# Patient Record
Sex: Female | Born: 1970 | Race: Black or African American | Hispanic: No | Marital: Married | Smoking: Never smoker
Health system: Southern US, Community
[De-identification: ages and names within clinical notes are randomized; demographics above are authoritative.]

## PROBLEM LIST (undated history)

## (undated) DIAGNOSIS — D649 Anemia, unspecified: Secondary | ICD-10-CM

## (undated) HISTORY — PX: APPENDECTOMY: SHX54

## (undated) HISTORY — DX: Anemia, unspecified: D64.9

## (undated) HISTORY — PX: ADRENALECTOMY: SHX876

## (undated) HISTORY — PX: ABDOMINAL HYSTERECTOMY: SHX81

---

## 2016-03-08 DIAGNOSIS — N281 Cyst of kidney, acquired: Secondary | ICD-10-CM | POA: Insufficient documentation

## 2016-03-08 DIAGNOSIS — N838 Other noninflammatory disorders of ovary, fallopian tube and broad ligament: Secondary | ICD-10-CM

## 2016-03-08 DIAGNOSIS — D352 Benign neoplasm of pituitary gland: Secondary | ICD-10-CM | POA: Insufficient documentation

## 2016-03-08 DIAGNOSIS — K862 Cyst of pancreas: Secondary | ICD-10-CM

## 2016-03-08 HISTORY — DX: Other noninflammatory disorders of ovary, fallopian tube and broad ligament: N83.8

## 2016-03-08 HISTORY — DX: Benign neoplasm of pituitary gland: D35.2

## 2016-03-08 HISTORY — DX: Cyst of kidney, acquired: N28.1

## 2016-11-10 DIAGNOSIS — L719 Rosacea, unspecified: Secondary | ICD-10-CM

## 2016-11-10 DIAGNOSIS — D352 Benign neoplasm of pituitary gland: Secondary | ICD-10-CM

## 2016-11-10 HISTORY — DX: Benign neoplasm of pituitary gland: D35.2

## 2016-11-10 HISTORY — DX: Rosacea, unspecified: L71.9

## 2019-11-02 ENCOUNTER — Other Ambulatory Visit: Payer: Self-pay

## 2019-11-02 ENCOUNTER — Ambulatory Visit (INDEPENDENT_AMBULATORY_CARE_PROVIDER_SITE_OTHER): Payer: 59 | Admitting: Adult Health Nurse Practitioner

## 2019-11-02 ENCOUNTER — Ambulatory Visit: Payer: Self-pay | Admitting: Adult Health Nurse Practitioner

## 2019-11-02 ENCOUNTER — Telehealth: Payer: Self-pay

## 2019-11-02 VITALS — BP 125/78 | HR 73 | Temp 98.0°F | Ht 62.0 in | Wt 171.6 lb

## 2019-11-02 DIAGNOSIS — M545 Low back pain, unspecified: Secondary | ICD-10-CM

## 2019-11-02 DIAGNOSIS — E559 Vitamin D deficiency, unspecified: Secondary | ICD-10-CM | POA: Diagnosis not present

## 2019-11-02 DIAGNOSIS — E236 Other disorders of pituitary gland: Secondary | ICD-10-CM | POA: Diagnosis not present

## 2019-11-02 DIAGNOSIS — E229 Hyperfunction of pituitary gland, unspecified: Secondary | ICD-10-CM

## 2019-11-02 DIAGNOSIS — D352 Benign neoplasm of pituitary gland: Secondary | ICD-10-CM | POA: Diagnosis not present

## 2019-11-02 LAB — POCT URINALYSIS DIP (MANUAL ENTRY)
Bilirubin, UA: NEGATIVE
Glucose, UA: NEGATIVE mg/dL
Ketones, POC UA: NEGATIVE mg/dL
Leukocytes, UA: NEGATIVE
Nitrite, UA: NEGATIVE
Protein Ur, POC: NEGATIVE mg/dL
Spec Grav, UA: >=1.03 — AB (ref 1.010–1.025)
Urobilinogen, UA: 0.2 E.U./dL
pH, UA: 5.5 (ref 5.0–8.0)

## 2019-11-02 NOTE — Telephone Encounter (Signed)
LVM for pt to come back to the office so that we can do an ear lavage on her. She left before it was done.

## 2019-11-02 NOTE — Progress Notes (Signed)
  Chief Complaint  Patient presents with  . Establish Care    Pt stated that 4 years ago she had her uterus removed and now she has been experiencing lower back pain    HPI  Back pain:  Sitting down, no back pain at night.  No change in bowel or bladder habits.  Walking. No medicaiton.   Multiple surgeries:  Hasn't been same since.   Cancer in family:  Mom--liver ca,  Father high blood pressure/dm DM sister  Jody Heath has diet.   Mammogram 3 years ago  Anemia until 3 years ago  Small cyst on thyroid  No fatigue, sleeping okay, no anxiety or depression,   2 children, 10 and 15,  Home  Last labs > 3 years ago.    Needs mammo  Problem List    Problem List: There are no relevant problems documented for this patient.   Allergies   has no allergies on file.  Medications   No current outpatient medications on file.   Review of Systems    Constitutional: Negative for activity change, appetite change, chills and fever.  HENT: Negative for congestion, nosebleeds, trouble swallowing and voice change.   Respiratory: Negative for cough, shortness of breath and wheezing.   Cardiac:  Negative for chest pain, pressure, syncope  Gastrointestinal: Negative for diarrhea, nausea and vomiting.  Genitourinary: Negative for difficulty urinating, dysuria, flank pain and hematuria.  Musculoskeletal: Negative for back pain, joint swelling and neck pain.  Neurological: Negative for dizziness, speech difficulty, light-headedness and numbness.  See HPI. All other review of systems negative.     Physical Exam:    height is 5\' 2"  (1.575 m) and weight is 171 lb 9.6 oz (77.8 kg). Her temporal temperature is 98 F (36.7 C). Her blood pressure is 125/78 and her pulse is 73. Her oxygen saturation is 99%.   Physical Examination: General appearance - alert, well appearing, and in no distress and oriented to person, place, and time Mental status - normal mood, behavior, speech, dress, motor  activity, and thought processes Eyes - PERRL. Extraocular movements intact.  No nystagmus.  Neck - supple, no significant adenopathy, carotids upstroke normal bilaterally, no bruits, thyroid exam: thyroid is normal in size without nodules or tenderness Chest - clear to auscultation, no wheezes, rales or rhonchi, symmetric air entry  Heart - normal rate, regular rhythm, normal S1, S2, no murmurs, rubs, clicks or gallops Extremities - dependent LE edema without clubbing or cyanosis Skin - normal coloration and turgor, no rashes, no suspicious skin lesions noted  No hyperpigmentation of skin.  No current hematomas noted   Lab /Imaging Review    orders written for new lab studies as appropriate; see orders.   Assessment & Plan:  Jody Heath is a 49 y.o. female    1. Low back pain, unspecified back pain laterality, unspecified chronicity, unspecified whether sciatica present   2. 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 Prolactin deficiency ([place])   4. Vitamin D deficiency    Orders Placed This Encounter  Procedures  . POCT urinalysis dipstick   No orders of the defined types were placed in this encounter.    Glyn Ade, NP

## 2019-11-02 NOTE — Patient Instructions (Signed)
° ° ° °  If you have lab work done today you will be contacted with your lab results within the next 2 weeks.  If you have not heard from us then please contact us. The fastest way to get your results is to register for My Chart. ° ° °IF you received an x-ray today, you will receive an invoice from [facility]. Please contact [facility] at [phone] with questions or concerns regarding your invoice.  ° °IF you received labwork today, you will receive an invoice from [facility]. Please contact [facility] at [phone] with questions or concerns regarding your invoice.  ° °Our billing staff will not be able to assist you with questions regarding bills from these companies. ° °You will be contacted with the lab results as soon as they are available. The fastest way to get your results is to activate your My Chart account. Instructions are located on the last page of this paperwork. If you have not heard from us regarding the results in 2 weeks, please contact this office. °  ° ° ° °

## 2019-11-03 LAB — PROLACTIN: Prolactin: 109 ng/mL — ABNORMAL HIGH (ref 4.8–23.3)

## 2019-11-03 LAB — CBC WITH DIFFERENTIAL/PLATELET
Basophils Absolute: 0 10*3/uL (ref 0.0–0.2)
Basos: 0 %
EOS (ABSOLUTE): 0.2 10*3/uL (ref 0.0–0.4)
Eos: 5 %
Hematocrit: 44.7 % (ref 34.0–46.6)
Hemoglobin: 15.4 g/dL (ref 11.1–15.9)
Immature Grans (Abs): 0 10*3/uL (ref 0.0–0.1)
Immature Granulocytes: 0 %
Lymphocytes Absolute: 1.3 10*3/uL (ref 0.7–3.1)
Lymphs: 42 %
MCH: 30.6 pg (ref 26.6–33.0)
MCHC: 34.5 g/dL (ref 31.5–35.7)
MCV: 89 fL (ref 79–97)
Monocytes Absolute: 0.3 10*3/uL (ref 0.1–0.9)
Monocytes: 9 %
Neutrophils Absolute: 1.4 10*3/uL (ref 1.4–7.0)
Neutrophils: 44 %
Platelets: 156 10*3/uL (ref 150–450)
RBC: 5.03 x10E6/uL (ref 3.77–5.28)
RDW: 12.4 % (ref 11.7–15.4)
WBC: 3.1 10*3/uL — ABNORMAL LOW (ref 3.4–10.8)

## 2019-11-03 LAB — CMP14+EGFR
ALT: 22 IU/L (ref 0–32)
AST: 24 IU/L (ref 0–40)
Albumin/Globulin Ratio: 1.6 (ref 1.2–2.2)
Albumin: 4.8 g/dL (ref 3.8–4.8)
Alkaline Phosphatase: 66 IU/L (ref 39–117)
BUN/Creatinine Ratio: 9 (ref 9–23)
BUN: 7 mg/dL (ref 6–24)
Bilirubin Total: 0.5 mg/dL (ref 0.0–1.2)
CO2: 20 mmol/L (ref 20–29)
Calcium: 10.1 mg/dL (ref 8.7–10.2)
Chloride: 102 mmol/L (ref 96–106)
Creatinine, Ser: 0.75 mg/dL (ref 0.57–1.00)
GFR calc Af Amer: 110 mL/min/{1.73_m2} (ref >59)
GFR calc non Af Amer: 95 mL/min/{1.73_m2} (ref >59)
Globulin, Total: 3 g/dL (ref 1.5–4.5)
Glucose: 95 mg/dL (ref 65–99)
Potassium: 4.3 mmol/L (ref 3.5–5.2)
Sodium: 140 mmol/L (ref 134–144)
Total Protein: 7.8 g/dL (ref 6.0–8.5)

## 2019-11-03 LAB — LIPID PANEL
Chol/HDL Ratio: 3.8 ratio (ref 0.0–4.4)
Cholesterol, Total: 196 mg/dL (ref 100–199)
HDL: 51 mg/dL (ref >39)
LDL Chol Calc (NIH): 125 mg/dL — ABNORMAL HIGH (ref 0–99)
Triglycerides: 112 mg/dL (ref 0–149)
VLDL Cholesterol Cal: 20 mg/dL (ref 5–40)

## 2019-11-03 LAB — VITAMIN D 25 HYDROXY (VIT D DEFICIENCY, FRACTURES): Vit D, 25-Hydroxy: 21.5 ng/mL — ABNORMAL LOW (ref 30.0–100.0)

## 2019-11-03 LAB — PTH, INTACT AND CALCIUM: PTH: 35 pg/mL (ref 15–65)

## 2019-11-03 LAB — TSH: TSH: 3.93 u[IU]/mL (ref 0.450–4.500)

## 2019-11-09 ENCOUNTER — Other Ambulatory Visit: Payer: Self-pay

## 2019-11-09 ENCOUNTER — Encounter: Payer: Self-pay | Admitting: Adult Health Nurse Practitioner

## 2019-11-09 ENCOUNTER — Ambulatory Visit (INDEPENDENT_AMBULATORY_CARE_PROVIDER_SITE_OTHER): Payer: 59 | Admitting: Adult Health Nurse Practitioner

## 2019-11-09 VITALS — BP 113/70 | HR 75 | Temp 97.8°F | Ht 62.0 in | Wt 168.0 lb

## 2019-11-09 DIAGNOSIS — E559 Vitamin D deficiency, unspecified: Secondary | ICD-10-CM | POA: Insufficient documentation

## 2019-11-09 DIAGNOSIS — D352 Benign neoplasm of pituitary gland: Secondary | ICD-10-CM | POA: Insufficient documentation

## 2019-11-09 DIAGNOSIS — E236 Other disorders of pituitary gland: Secondary | ICD-10-CM

## 2019-11-09 DIAGNOSIS — M47819 Spondylosis without myelopathy or radiculopathy, site unspecified: Secondary | ICD-10-CM

## 2019-11-09 DIAGNOSIS — M4055 Lordosis, unspecified, thoracolumbar region: Secondary | ICD-10-CM

## 2019-11-09 DIAGNOSIS — E229 Hyperfunction of pituitary gland, unspecified: Secondary | ICD-10-CM

## 2019-11-09 DIAGNOSIS — E221 Hyperprolactinemia: Secondary | ICD-10-CM

## 2019-11-09 DIAGNOSIS — M545 Low back pain, unspecified: Secondary | ICD-10-CM

## 2019-11-09 HISTORY — DX: Benign neoplasm of pituitary gland: D35.2

## 2019-11-09 HISTORY — DX: Lordosis, unspecified, thoracolumbar region: M40.55

## 2019-11-09 HISTORY — DX: Spondylosis without myelopathy or radiculopathy, site unspecified: M47.819

## 2019-11-09 HISTORY — DX: Vitamin D deficiency, unspecified: E55.9

## 2019-11-09 HISTORY — DX: Other disorders of pituitary gland: E23.6

## 2019-11-09 HISTORY — DX: Low back pain, unspecified: M54.50

## 2019-11-09 HISTORY — DX: Hyperprolactinemia: E22.1

## 2019-11-09 MED ORDER — VITAMIN D (ERGOCALCIFEROL) 1.25 MG (50000 UNIT) PO CAPS: 50000.0000 [IU] | ORAL_CAPSULE | ORAL | 3 refills | Status: AC

## 2019-11-09 NOTE — Progress Notes (Signed)
Chief Complaint  Patient presents with  . Back Pain    pt states the pain is mostly the same as last visit pain starts at the top of the back and gos all the way down. pt states she is having more intence pain in her Lower left side of her back.  . Alopecia    started 2 years ago. pt hasn't tryied any [facility] medications or shampoos to help.     HPI  Patient presents for follow-up after her labs.  Her prolactin was extremely elevated.  With a history of a pituitary microadenoma, will refer to endocrinology for further management.  Thoracic back pain and low back pain approximately the same and located in the paraspinal muscles.  I tried to explain to the patient that this is has a lot to do with the muscles in the area as she thinks that the pain started after her uterus was removed.  Certainly if the muscles were interrupted by a surgery she would have some laxity in that area which could cause further back pain.  We discussed some exercises she can do for her back and she was given a printout on further exercises.  In addition, she declines any anti-inflammatories but is amenable to a physical therapy.  She is having some hair loss.  I am not certain that this is not from what is going on with her pituitary microadenoma, vitamin D, or potentially subclinical hypothyroidism.  However I would like to supplement her vitamin D as well as have her see the endocrinologist before addressing this with any medication, or further evaluation.  Lipids were normal.  CBC was normal with the exception of mildly low white blood cell count at 3.1.  We will repeat that in 6 weeks when the patient returns. 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  Medications    Current Outpatient Medications:  Marland Kitchen  Vitamin D, Ergocalciferol, (DRISDOL) 1.25 MG (50000 UNIT) CAPS capsule, Take 1 capsule (50,000 Units total) by mouth every 7 (seven) days., Disp: 5 capsule, Rfl: 3   Review of Systems    Constitutional: Negative for activity change, appetite change, chills and fever.  HENT: Negative for congestion, nosebleeds, trouble swallowing and voice change.   Respiratory: Negative for cough, shortness of breath and wheezing.   Cardiac:  Negative for chest pain, pressure, syncope  Gastrointestinal: Negative for diarrhea, nausea and vomiting.  Genitourinary: Negative for difficulty urinating, dysuria, flank pain and hematuria.  Musculoskeletal: Negative for back pain, joint swelling and neck pain.  Neurological: Negative for dizziness, speech difficulty, light-headedness and numbness.  See HPI. All other review of systems negative.     Physical Exam:    height is 5\' 2"  (1.575 m) and weight is 168 lb (76.2 kg). Her temporal temperature is 97.8 F (36.6 C). Her blood pressure is 113/70 and her pulse is 75. Her oxygen saturation is 98%.   Physical Examination: General appearance - alert, well appearing, and in no distress and oriented to person, place, and time Mental status - normal mood, behavior, speech, dress, motor activity, and thought processes Eyes - PERRL. Extraocular movements intact.  No nystagmus.  Neck - supple, no significant adenopathy, carotids upstroke normal bilaterally, no bruits, thyroid exam: thyroid is normal in size without nodules or  tenderness Chest - clear to auscultation, no wheezes, rales or rhonchi, symmetric air entry  Heart - normal rate, regular rhythm, normal S1, S2, no murmurs, rubs, clicks or gallops Extremities - dependent LE edema without clubbing or cyanosis Skin - normal coloration and turgor, no rashes, no suspicious skin lesions noted  No hyperpigmentation of skin.  No current hematomas  noted   Lab /Imaging Review    See HPI for review  Assessment & Plan:  Janaysia Stertz is a 49 y.o. female    1. Thoracic lordosis   2. Facet arthropathy, multilevel   3. 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 Hyperprolactinemia ([place])    Orders Placed This Encounter  Procedures  . Ambulatory referral to Physical Therapy  . Ambulatory referral to Endocrinology   Meds ordered this encounter  Medications  . Vitamin D, Ergocalciferol, (DRISDOL) 1.25 MG (50000 UNIT) CAPS capsule    Sig: Take 1 capsule (50,000 Units total) by mouth every 7 (seven) days.    Dispense:  5 capsule    Refill:  3   A total of 35 minutes were spent face-to-face with the patient during this encounter and over half of that time was spent on counseling and coordination of care.   Glyn Ade, NP

## 2019-11-09 NOTE — Patient Instructions (Addendum)
Low Back Sprain or Strain Rehab Ask your health care provider which exercises are safe for you. Do exercises exactly as told by your health care provider and adjust them as directed. It is normal to feel mild stretching, pulling, tightness, or discomfort as you do these exercises. Stop right away if you feel sudden pain or your pain gets worse. Do not begin these exercises until told by your health care provider. Stretching and range-of-motion exercises These exercises warm up your muscles and joints and improve the movement and flexibility of your back. These exercises also help to relieve pain, numbness, and tingling. Lumbar rotation  1. Lie on your back on a firm surface and bend your knees. 2. Straighten your arms out to your sides so each arm forms a 90-degree angle (right angle) with a side of your body. 3. Slowly move (rotate) both of your knees to one side of your body until you feel a stretch in your lower back (lumbar). Try not to let your shoulders lift off the floor. 4. Hold this position for __________ seconds. 5. Tense your abdominal muscles and slowly move your knees back to the starting position. 6. Repeat this exercise on the other side of your body. Repeat __________ times. Complete this exercise __________ times a day. Single knee to chest  1. Lie on your back on a firm surface with both legs straight. 2. Bend one of your knees. Use your hands to move your knee up toward your chest until you feel a gentle stretch in your lower back and buttock. ? Hold your leg in this position by holding on to the front of your knee. ? Keep your other leg as straight as possible. 3. Hold this position for __________ seconds. 4. Slowly return to the starting position. 5. Repeat with your other leg. Repeat __________ times. Complete this exercise __________ times a day. Prone extension on elbows  1. Lie on your abdomen on a firm surface (prone position). 2. Prop yourself up on your  elbows. 3. Use your arms to help lift your chest up until you feel a gentle stretch in your abdomen and your lower back. ? This will place some of your body weight on your elbows. If this is uncomfortable, try stacking pillows under your chest. ? Your hips should stay down, against the surface that you are lying on. Keep your hip and back muscles relaxed. 4. Hold this position for __________ seconds. 5. Slowly relax your upper body and return to the starting position. Repeat __________ times. Complete this exercise __________ times a day. Strengthening exercises These exercises build strength and endurance in your back. Endurance is the ability to use your muscles for a long time, even after they get tired. Pelvic tilt This exercise strengthens the muscles that lie deep in the abdomen. 1. Lie on your back on a firm surface. Bend your knees and keep your feet flat on the floor. 2. Tense your abdominal muscles. Tip your pelvis up toward the ceiling and flatten your lower back into the floor. ? To help with this exercise, you may place a small towel under your lower back and try to push your back into the towel. 3. Hold this position for __________ seconds. 4. Let your muscles relax completely before you repeat this exercise. Repeat __________ times. Complete this exercise __________ times a day. Alternating arm and leg raises  1. Get on your hands and knees on a firm surface. If you are on a hard floor,  you may want to use padding, such as an exercise mat, to cushion your knees. 2. Line up your arms and legs. Your hands should be directly below your shoulders, and your knees should be directly below your hips. 3. Lift your left leg behind you. At the same time, raise your right arm and straighten it in front of you. ? Do not lift your leg higher than your hip. ? Do not lift your arm higher than your shoulder. ? Keep your abdominal and back muscles tight. ? Keep your hips facing the  ground. ? Do not arch your back. ? Keep your balance carefully, and do not hold your breath. 4. Hold this position for __________ seconds. 5. Slowly return to the starting position. 6. Repeat with your right leg and your left arm. Repeat __________ times. Complete this exercise __________ times a day. Abdominal set with straight leg raise  1. Lie on your back on a firm surface. 2. Bend one of your knees and keep your other leg straight. 3. Tense your abdominal muscles and lift your straight leg up, 4-6 inches (10-15 cm) off the ground. 4. Keep your abdominal muscles tight and hold this position for __________ seconds. ? Do not hold your breath. ? Do not arch your back. Keep it flat against the ground. 5. Keep your abdominal muscles tense as you slowly lower your leg back to the starting position. 6. Repeat with your other leg. Repeat __________ times. Complete this exercise __________ times a day. Single leg lower with bent knees 1. Lie on your back on a firm surface. 2. Tense your abdominal muscles and lift your feet off the floor, one foot at a time, so your knees and hips are bent in 90-degree angles (right angles). ? Your knees should be over your hips and your lower legs should be parallel to the floor. 3. Keeping your abdominal muscles tense and your knee bent, slowly lower one of your legs so your toe touches the ground. 4. Lift your leg back up to return to the starting position. ? Do not hold your breath. ? Do not let your back arch. Keep your back flat against the ground. 5. Repeat with your other leg. Repeat __________ times. Complete this exercise __________ times a day. Posture and body mechanics Good posture and healthy body mechanics can help to relieve stress in your body's tissues and joints. Body mechanics refers to the movements and positions of your body while you do your daily activities. Posture is part of body mechanics. Good posture means:  Your spine is in its  natural S-curve position (neutral).  Your shoulders are pulled back slightly.  Your head is not tipped forward. Follow these guidelines to improve your posture and body mechanics in your everyday activities. Standing   When standing, keep your spine neutral and your feet about hip width apart. Keep a slight bend in your knees. Your ears, shoulders, and hips should line up.  When you do a task in which you stand in one place for a long time, place one foot up on a stable object that is 2-4 inches (5-10 cm) high, such as a footstool. This helps keep your spine neutral. Sitting   When sitting, keep your spine neutral and keep your feet flat on the floor. Use a footrest, if necessary, and keep your thighs parallel to the floor. Avoid rounding your shoulders, and avoid tilting your head forward.  When working at a desk or a Teaching laboratory technician, keep your  desk at a height where your hands are slightly lower than your elbows. Slide your chair under your desk so you are close enough to maintain good posture.  When working at a computer, place your monitor at a height where you are looking straight ahead and you do not have to tilt your head forward or downward to look at the screen. Resting  When lying down and resting, avoid positions that are most painful for you.  If you have pain with activities such as sitting, bending, stooping, or squatting, lie in a position in which your body does not bend very much. For example, avoid curling up on your side with your arms and knees near your chest (fetal position).  If you have pain with activities such as standing for a long time or reaching with your arms, lie with your spine in a neutral position and bend your knees slightly. Try the following positions: ? Lying on your side with a pillow between your knees. ? Lying on your back with a pillow under your knees. Lifting   When lifting objects, keep your feet at least shoulder width apart and tighten your  abdominal muscles.  Bend your knees and hips and keep your spine neutral. It is important to lift using the strength of your legs, not your back. Do not lock your knees straight out.  Always ask for help to lift heavy or awkward objects. This information is not intended to replace advice given to you by your health care provider. Make sure you discuss any questions you have with your health care provider. Document Revised: 11/24/2018 Document Reviewed: 08/24/2018 Elsevier Patient Education  [place].  Thoracic Strain Rehab Ask your health care provider which exercises are safe for you. Do exercises exactly as told by your health care provider and adjust them as directed. It is normal to feel mild stretching, pulling, tightness, or discomfort as you do these exercises. Stop right away if you feel sudden pain or your pain gets worse. Do not begin these exercises until told by your health care provider. Stretching and range-of-motion exercise This exercise warms up your muscles and joints and improves the movement and flexibility of your back and shoulders. This exercise also helps to relieve pain. Chest and spine stretch  7. Lie down on your back on a firm surface. 8. Roll a towel or a small blanket so it is about 4 inches (10 cm) in diameter. 9. Put the towel lengthwise under the middle of your back so it is under your spine, but not under your shoulder blades. 10. Put your hands behind your head and let your elbows fall to your sides. This will increase your stretch. 11. Take a deep breath (inhale). 12. Hold for __________ seconds. 13. Relax after you breathe out (exhale). Repeat __________ times. Complete this exercise __________ times a day. Strengthening exercises These exercises build strength and endurance in your back and your shoulder blade muscles. Endurance is the ability to use your muscles for a long time, even after they get tired. Alternating arm and leg  raises  6. Get on your hands and knees on a firm surface. If you are on a hard floor, you may want to use padding, such as an exercise mat, to cushion your knees. 7. Line up your arms and legs. Your hands should be directly below your shoulders, and your knees should be directly below your hips. 8. Lift your left leg behind you. At the same time, raise  your right arm and straighten it in front of you. ? Do not lift your leg higher than your hip. ? Do not lift your arm higher than your shoulder. ? Keep your abdominal and back muscles tight. ? Keep your hips facing the ground. ? Do not arch your back. ? Keep your balance carefully, and do not hold your breath. 9. Hold for __________ seconds. 10. Slowly return to the starting position and repeat with your right leg and your left arm. Repeat __________ times. Complete this exercise __________ times a day. Straight arm rows This exercise is also called shoulder extension exercise. 6. Stand with your feet shoulder width apart. 7. Secure an exercise band to a stable object in front of you so the band is at or above shoulder height. 8. Hold one end of the exercise band in each hand. 9. Straighten your elbows and lift your hands up to shoulder height. 10. Step back, away from the secured end of the exercise band, until the band stretches. 11. Squeeze your shoulder blades together and pull your hands down to the sides of your thighs. Stop when your hands are straight down by your sides. This is shoulder extension. Do not let your hands go behind your body. 12. Hold for __________ seconds. 13. Slowly return to the starting position. Repeat __________ times. Complete this exercise __________ times a day. Prone shoulder external rotation 5. Lie on your abdomen on a firm bed so your left / right forearm hangs over the edge of the bed and your upper arm is on the bed, straight out from your body. This is the prone position. ? Your elbow should be  bent. ? Your palm should be facing your feet. 6. If instructed, hold a __________ weight in your hand. 7. Squeeze your shoulder blade toward the middle of your back. Do not let your shoulder lift toward your ear. 8. Keep your elbow bent in a 90-degree angle (right angle) while you slowly move your forearm up toward the ceiling. Move your forearm up to the height of the bed, toward your head. This is external rotation. ? Your upper arm should not move. ? At the top of the movement, your palm should face the floor. 9. Hold for __________ seconds. 10. Slowly return to the starting position and relax your muscles. Repeat __________ times. Complete this exercise __________ times a day. Rowing scapular retraction This is an exercise in which the shoulder blades (scapulae) are pulled toward each other (retraction). 7. Sit in a stable chair without armrests, or stand up. 8. Secure an exercise band to a stable object in front of you so the band is at shoulder height. 9. Hold one end of the exercise band in each hand. Your palms should face down. 10. Bring your arms out straight in front of you. 11. Step back, away from the secured end of the exercise band, until the band stretches. 12. Pull the band backward. As you do this, bend your elbows and squeeze your shoulder blades together, but avoid letting the rest of your body move. Do not shrug your shoulders upward while you do this. 13. Stop when your elbows are at your sides or slightly behind your body. 14. Hold for __________ seconds. 15. Slowly straighten your arms to return to the starting position. Repeat __________ times. Complete this exercise __________ times a day. Posture and body mechanics Good posture and healthy body mechanics can help to relieve stress in your body's tissues and joints. Body  mechanics refers to the movements and positions of your body while you do your daily activities. Posture is part of body mechanics. Good posture  means:  Your spine is in its natural S-curve position (neutral).  Your shoulders are pulled back slightly.  Your head is not tipped forward. Follow these guidelines to improve your posture and body mechanics in your everyday activities. Standing   When standing, keep your spine neutral and your feet about hip width apart. Keep a slight bend in your knees. Your ears, shoulders, and hips should line up with each other.  When you do a task in which you lean forward while standing in one place for a long time, place one foot up on a stable object that is 2-4 inches (5-10 cm) high, such as a footstool. This helps keep your spine neutral. Sitting   When sitting, keep your spine neutral and keep your feet flat on the floor. Use a footrest, if necessary, and keep your thighs parallel to the floor. Avoid rounding your shoulders, and avoid tilting your head forward.  When working at a desk or a computer, keep your desk at a height where your hands are slightly lower than your elbows. Slide your chair under your desk so you are close enough to maintain good posture.  When working at a computer, place your monitor at a height where you are looking straight ahead and you do not have to tilt your head forward or downward to look at the screen. Resting When lying down and resting, avoid positions that are most painful for you.  If you have pain with activities such as sitting, bending, stooping, or squatting (flexion-basedactivities), lie in a position in which your body does not bend very much. For example, avoid curling up on your side with your arms and knees near your chest (fetal position).  If you have pain with activities such as standing for a long time or reaching with your arms (extension-basedactivities), lie with your spine in a neutral position and bend your knees slightly. Try the following positions: ? Lie on your side with a pillow between your knees. ? Lie on your back with a pillow  under your knees.  Lifting   When lifting objects, keep your feet at least shoulder width apart and tighten your abdominal muscles.  Bend your knees and hips and keep your spine neutral. It is important to lift using the strength of your legs, not your back. Do not lock your knees straight out.  Always ask for help to lift heavy or awkward objects. This information is not intended to replace advice given to you by your health care provider. Make sure you discuss any questions you have with your health care provider. Document Revised: 11/24/2018 Document Reviewed: 09/11/2018 Elsevier Patient Education  [place].

## 2019-11-21 ENCOUNTER — Encounter: Payer: Self-pay | Admitting: Endocrinology

## 2019-11-21 NOTE — Progress Notes (Signed)
Patient ID: Jody Heath, female   DOB: 1970-11-05, 49 y.o.   MRN: [id number]             Referring PCP: Wendall Mola, NP  Chief complaint: High prolactin  History of Present Illness   She apparently had problems with milk discharge from her breasts and 2018 and was found to have high prolactin level This was done by a physician in [place] and no records are available She does not know what her prolactin level was or what her MRI scan showed  She has had a hysterectomy a few years ago  She does not complain of any unusual headaches or change in vision  She thinks that with taking cabergoline half tablet twice a week her prolactin level had come down and after about 1-1/2 years the medication was stopped but she did not have a follow-up subsequently  She says she is not sure whether she has milk discharge from her breast as she does not check for this but did not have any discharge spontaneously  Prolactin levels:  Lab Results  Component Value Date   PROLACTIN 109.0 (H) 11/02/2019     MRI of pituitary gland: Report from 2018 not available   She is now referred here for further management  Allergies as of 11/22/2019   No Known Allergies     Medication List       Accurate as of November 21, 2019  9:06 PM. If you have any questions, ask your nurse or doctor.        Vitamin D (Ergocalciferol) 1.25 MG (50000 UNIT) Caps capsule Commonly known as: DRISDOL Take 1 capsule (50,000 Units total) by mouth every 7 (seven) days.       Allergies: No Known Allergies  Past Medical History:  Diagnosis Date  . Hyperprolactinemia ([place]) 11/09/2019    History reviewed. No pertinent surgical history.  History reviewed. No pertinent family history.  Social History:  reports that she has never smoked. She has never used smokeless tobacco. No history on file for alcohol and drug.   Review of Systems  Constitutional: Negative for weight loss.  HENT: Negative for  headaches.   Eyes: Negative for visual disturbance.  Respiratory: Negative for shortness of breath.   Cardiovascular: Negative for leg swelling.  Endocrine: Negative for fatigue.  Musculoskeletal: Negative for joint pain.  Neurological: Negative for numbness.   She was found to have vitamin D deficiency on routine testing and is being treated by PCP with prescription vitamin D2  EXAM:  There were no vitals taken for this visit.  Physical Exam Constitutional:      Appearance: Normal appearance.  HENT:     Nose:     Comments: Oral exam not indicated Eyes:     Conjunctiva/sclera: Conjunctivae normal.     Comments: Optic disks normal on fundus exam  Neck:     Thyroid: No thyromegaly.  Cardiovascular:     Rate and Rhythm: Regular rhythm.     Heart sounds: Normal heart sounds. No murmur. No gallop.   Pulmonary:     Breath sounds: Normal breath sounds. No wheezing or rales.  Abdominal:     General: There is no distension.     Palpations: Abdomen is soft. There is no mass.     Tenderness: There is no abdominal tenderness.  Musculoskeletal:        General: No swelling.  Lymphadenopathy:     Cervical: No cervical adenopathy.  Skin:    General: Skin  is warm and dry.     Coloration: Skin is not pale.  Neurological:     Deep Tendon Reflexes: Reflexes normal.      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 She has minimal symptoms although previously had galactorrhea Unable to assess her menstrual cycles because of hysterectomy Although she reportedly had an MRI done in 2018 no reports are available No symptoms of headaches or visual disturbances suggestive of a large pituitary adenoma Also no systemic symptoms of unusual fatigue to indicate hypopituitarism    PLAN:     Restart Dostinex 0.5 mg, half tablet twice a week Follow-up in about 6 weeks to reassess response and adjust dose as needed MRI of pituitary gland Will  request records of previous evaluation from her treating physician in [place]  Copy of the consultation has been sent to the referring PCP  Elayne Snare 11/21/19   Note: This office note was prepared with Dragon voice recognition system technology. Any transcriptional errors that result from this process are unintentional.

## 2019-11-22 ENCOUNTER — Other Ambulatory Visit: Payer: Self-pay

## 2019-11-22 ENCOUNTER — Encounter: Payer: Self-pay | Admitting: Endocrinology

## 2019-11-22 ENCOUNTER — Ambulatory Visit (INDEPENDENT_AMBULATORY_CARE_PROVIDER_SITE_OTHER): Payer: 59 | Admitting: Endocrinology

## 2019-11-22 VITALS — BP 110/60 | HR 83 | Ht 62.0 in | Wt 167.4 lb

## 2019-11-22 DIAGNOSIS — E221 Hyperprolactinemia: Secondary | ICD-10-CM

## 2019-11-22 DIAGNOSIS — N643 Galactorrhea not associated with childbirth: Secondary | ICD-10-CM | POA: Diagnosis not present

## 2019-11-22 MED ORDER — CABERGOLINE 0.5 MG PO TABS: 0.2500 mg | ORAL_TABLET | ORAL | 0 refills | Status: DC

## 2019-11-30 ENCOUNTER — Ambulatory Visit: Payer: 59 | Attending: Adult Health Nurse Practitioner | Admitting: Physical Therapy

## 2019-11-30 ENCOUNTER — Other Ambulatory Visit: Payer: Self-pay

## 2019-11-30 ENCOUNTER — Encounter: Payer: Self-pay | Admitting: Physical Therapy

## 2019-11-30 DIAGNOSIS — M545 Low back pain, unspecified: Secondary | ICD-10-CM

## 2019-11-30 DIAGNOSIS — M546 Pain in thoracic spine: Secondary | ICD-10-CM | POA: Insufficient documentation

## 2019-11-30 NOTE — Patient Instructions (Signed)
    Access Code: [email or web address]: [email or web address]: 04/16/2021Prepared by: Anderson Malta PaaExercises  Cat-Camel - 2 x daily - 7 x weekly - 2 sets - 10 reps - 10 hold  Supine Lower Trunk Rotation - 1 x daily - 7 x weekly - 1 sets - 10 reps - 30 hold  Sidelying Thoracic Rotation with Open Book - 1 x daily - 7 x weekly - 1 sets - 10 reps - 15 hold  Supine Bridge - 1 x daily - 7 x weekly - 2 sets - 10 reps - 5 hold

## 2019-11-30 NOTE — Therapy (Signed)
[place], [place], [address] Phone: [phone]   Fax:  [phone]  Physical Therapy Evaluation  Patient Details  Name: Jody Heath MRN: [id number] Date of Birth: June 13, 1971 Referring Provider (PT): Dr. Jens Som   Encounter Date: 11/30/2019  PT End of Session - 11/30/19 1457    Visit Number  1    Number of Visits  6    Date for PT Re-Evaluation  01/11/20    PT Start Time  0922    PT Stop Time  1003    PT Time Calculation (min)  41 min    Activity Tolerance  Patient tolerated treatment well    Behavior During Therapy  [facility] for tasks assessed/performed       Past Medical History:  Diagnosis Date  . Hyperprolactinemia ([place]) 11/09/2019    History reviewed. No pertinent surgical history.  There were no vitals filed for this visit.   Subjective Assessment - 11/30/19 0930    Subjective  Pt presents with back pain which began after her surgeries (about 3 yrs ago) not continuously. She reports increased freq of her back pain , especially with working long hours.  Pain is bilateral and in mid to low back.  She denies weakness.  She reports a disc problem long ago.    Patient is accompained by:  Interpreter    Pertinent History  3-4 abdominal surgeries, hysterectomy.    Limitations  Standing;Sitting;House hold activities    How long can you walk comfortably?  walks for exercise 2 hours daily    Diagnostic tests  Blood test, no recent XR    Patient Stated Goals  pain relief    Currently in Pain?  Yes    Pain Score  8     Pain Location  Back    Pain Orientation  Right;Left;Lower    Pain Descriptors / Indicators  Sharp;Aching    Pain Type  Chronic pain    Pain Onset  More than a month ago    Pain Frequency  Intermittent    Aggravating Factors   standing 4-5 hours    Pain Relieving Factors  Sitting, walking, changing positions    Effect of Pain on Daily Activities  hard to stand, do housework     Multiple Pain Sites  No         [facility] PT Assessment - 11/30/19 0001      Assessment   Medical Diagnosis  thoracic pain     Referring Provider (PT)  Dr. Jens Som    Prior Therapy  long ago       Precautions   Precautions  None      Balance Screen   Has the patient fallen in the past 6 months  No      [place] residence    Living Arrangements  Spouse/significant other;Children      Prior Function   Level of [place]  Works at home    Leisure  exercise, has 2 children      Cognition   Overall Cognitive Status  Within Functional Limits for tasks assessed      Observation/Other Assessments   Focus on Therapeutic Outcomes (FOTO)   NT due to language barrier       Sensation   Light Touch  Appears Intact      Posture/Postural Control   Posture/Postural Control  No significant limitations  Posture Comments  mildly reduced thoracic kyphosis      AROM   Overall AROM Comments  WNL in L spine , LEs and UEs       Strength   Overall Strength Comments  LEs WNL , including hip abd and ext.    lower abs fair      Palpation   Spinal mobility  hypomobile in T3 to T-L junction     Palpation comment  painful with palpation to paraspinals thoracic to low lumbar , hypertonicity noted throughout      Transfers   Comments  No issues       Ambulation/Gait   Gait Comments  Normal gait pattern                 Objective measurements completed on examination: See above findings.      [place] Adult PT Treatment/Exercise - 11/30/19 0001      Lumbar Exercises: Stretches   Lower Trunk Rotation  10 seconds    Lower Trunk Rotation Limitations  x 10     Other Lumbar Stretch Exercise  sidelyig upper trunk rotation x 5 each side       Lumbar Exercises: Supine   Bridge Limitations  articulating x 10       Lumbar Exercises: Sidelying   Other Sidelying Lumbar Exercises  upper trunk rotation x 5 each        Lumbar Exercises: Quadruped   Madcat/Old Horse  5 reps    Madcat/Old Horse Limitations  demo              PT Education - 11/30/19 1457    Education Details  spine stiffness, eval findings, POC, HEP    Person(s) Educated  Patient    Methods  Explanation;Demonstration;Verbal cues;Handout    Comprehension  Verbalized understanding;Returned demonstration;Need further instruction          PT Long Term Goals - 11/30/19 1458      PT LONG TERM GOAL #1   Title  Pt will be I with HEP for spine mobility and posture    Time  6    Period  Weeks    Status  New    Target Date  01/11/20      PT LONG TERM GOAL #2   Title  Pt will be able to lift, carry items in her home without increasing back pain, using proper technique    Time  6    Period  Weeks    Status  New    Target Date  01/11/20      PT LONG TERM GOAL #3   Title  Pt will be able to stand as long as she wants to with pain < 5/10.    Baseline  standing >4 hours pain 8/10    Time  6    Period  Weeks    Status  New    Target Date  01/11/20      PT LONG TERM GOAL #4   Title  Further goals TBA             Plan - 11/30/19 1500    Clinical Impression Statement  Pt presents for low complexity eval of chronic mid to low back pain without radicular symptoms.  She has normal AROM, strength and gait pattern.  She is able to stand 4-5 hours at a time but reports pain severe after this time. I encouraged her to stretch in addition to her walking routine.  She will  likely only need 4-6 visits of PT for corrective exercises.    Personal Factors and Comorbidities  Time since onset of injury/illness/exacerbation    Examination-Activity Limitations  Carry;Sit;Stand;Lift    Examination-Participation Restrictions  Yard Work;Cleaning;Laundry;Community Activity;Meal Prep;Shop    Stability/Clinical Decision Making  Stable/Uncomplicated    Clinical Decision Making  Low    Rehab Potential  Excellent    PT Frequency  1x / week     PT Duration  6 weeks    PT Treatment/Interventions  Therapeutic exercise;Dry needling;Manual techniques;Patient/family education;Moist Heat;Electrical Stimulation;Spinal Manipulations    PT Next Visit Plan  try T-L junction release/foam roller into ext. check HEP , begin core    PT Home Exercise Plan  spine mobility (cat/camel, bridge, rotation)    Consulted and Agree with Plan of Care  Patient       Patient will benefit from skilled therapeutic intervention in order to improve the following deficits and impairments:  Decreased strength, Hypomobility, Increased fascial restricitons, Decreased mobility, Impaired flexibility, Postural dysfunction  Visit Diagnosis: Pain in thoracic spine  Bilateral low back pain without sciatica, unspecified chronicity     Problem List Patient Active Problem List   Diagnosis Date Noted  . 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 Prolactin deficiency ([place]) 11/09/2019  . Vitamin D deficiency 11/09/2019  . Low back pain 11/09/2019  . 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 Thoracic lordosis 11/09/2019  . Hyperprolactinemia ([place]) 11/09/2019    Mark Hassey 11/30/2019, 3:07 PM  [facility] [address] [place], [place], [address] Phone: [phone]   Fax:  [phone]  Name: Jody Heath MRN: [id number] Date of Birth: [birth year]   Raeford Razor, PT 11/30/19 3:07 PM Phone: [phone] Fax: [phone]

## 2019-12-11 ENCOUNTER — Ambulatory Visit: Payer: 59 | Admitting: Physical Therapy

## 2019-12-11 ENCOUNTER — Other Ambulatory Visit: Payer: Self-pay

## 2019-12-11 DIAGNOSIS — M546 Pain in thoracic spine: Secondary | ICD-10-CM | POA: Diagnosis not present

## 2019-12-11 DIAGNOSIS — M545 Low back pain, unspecified: Secondary | ICD-10-CM

## 2019-12-11 NOTE — Therapy (Cosign Needed)
[place], [place], [address] Phone: [phone]   Fax:  [phone]  Physical Therapy Treatment  Patient Details  Name: Jody Heath MRN: [id number] Date of Birth: 1970/09/27 Referring Provider (PT): Dr. Jens Som   Encounter Date: 12/11/2019  PT End of Session - 12/11/19 0942    Visit Number  2    Date for PT Re-Evaluation  01/11/20    PT Start Time  0857    PT Stop Time  0940    PT Time Calculation (min)  43 min    Activity Tolerance  Patient tolerated treatment well    Behavior During Therapy  [facility] for tasks assessed/performed       Past Medical History:  Diagnosis Date  . Hyperprolactinemia ([place]) 11/09/2019    No past surgical history on file.  There were no vitals filed for this visit.  Subjective Assessment - 12/11/19 0859    Subjective  Pt states that she has discomfort not pain her mid back, she has been doing her home exercises, which has been makingher feel a "little better", stated increase in pain to 5/10 while doing standing trunk exercises    Currently in Pain?  No/denies    Pain Score  0-No pain                       [facility] Adult PT Treatment/Exercise - 12/11/19 0001      Exercises   Exercises  Shoulder      Lumbar Exercises: Aerobic   Elliptical  I 6, R5, 5 min    Nustep  L4x6 min      Lumbar Exercises: Standing   Other Standing Lumbar Exercises  trunk rotation w/ yellow tband, 2x10 each direction      Lumbar Exercises: Supine   Ab Set  10 reps;3 seconds    Other Supine Lumbar Exercises  throacic rotation S/L w/ 3 #   pain onset from standing     Shoulder Exercises: ROM/Strengthening   Lat Pull  1.5 plate;20 reps    Cybex Row  1.5 plate;20 reps                  PT Long Term Goals - 12/11/19 0948      PT LONG TERM GOAL #1   Title  Pt will be I with HEP for spine mobility and posture    Time  6    Period  Weeks    Status  Partially Met      PT LONG TERM GOAL #2   Title  Pt will be able to lift, carry items in her home without increasing back pain, using proper technique    Time  6    Period  Weeks    Status  On-going      PT LONG TERM GOAL #3   Title  Pt will be able to stand as long as she wants to with pain < 5/10.    Baseline  standing >4 hours pain 8/10    Time  6    Period  Weeks    Status  Partially Met      PT LONG TERM GOAL #4   Title  Further goals TBA    Status  On-going            Plan - 12/11/19 0943    Clinical Impression Statement  Pt showed up 15 min late to PT, translater  was present, shewas able to verbalized home exercises independently, her pain still is a 5/10 while standing, she seems to understand posture from the rows and lat pulldowns, pt tolerated treatment well.    Examination-Activity Limitations  Carry;Sit;Stand;Lift;Continence    Examination-Participation Restrictions  Yard Work;Cleaning;Laundry;Community Activity;Meal Prep;Shop    Stability/Clinical Decision Making  Stable/Uncomplicated    Rehab Potential  Good    PT Frequency  1x / week    PT Duration  6 weeks    PT Treatment/Interventions  Therapeutic exercise;Dry needling;Manual techniques;Patient/family education;Moist Heat;Electrical Stimulation;Spinal Manipulations    PT Next Visit Plan  try T-L junction release/foam roller into ext. check HEP , begin core       Patient will benefit from skilled therapeutic intervention in order to improve the following deficits and impairments:  Decreased strength, Hypomobility, Increased fascial restricitons, Decreased mobility, Impaired flexibility, Postural dysfunction  Visit Diagnosis: Pain in thoracic spine  Bilateral low back pain without sciatica, unspecified chronicity     Problem List Patient Active Problem List   Diagnosis Date Noted  . 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 Prolactin deficiency ([place]) 11/09/2019  .  Vitamin D deficiency 11/09/2019  . Low back pain 11/09/2019  . 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 Thoracic lordosis 11/09/2019  . Hyperprolactinemia ([place]) 11/09/2019    Clarene Essex, SPTA 12/11/2019, 9:51 AM  [place], [place], [address] Phone: [phone]   Fax:  [phone]  Name: Jody Heath MRN: [id number] Date of Birth: 10-03-1970

## 2019-12-11 NOTE — Therapy (Signed)
[place], [place], [address] Phone: [phone]   Fax:  [phone]  Physical Therapy Treatment  Patient Details  Name: Jody Heath MRN: [id number] Date of Birth: Feb 26, 1971 Referring Provider (PT): Dr. Jens Som   Encounter Date: 12/11/2019  PT End of Session - 12/11/19 0942    Visit Number  2    Date for PT Re-Evaluation  01/11/20    PT Start Time  0857    PT Stop Time  0940    PT Time Calculation (min)  43 min    Activity Tolerance  Patient tolerated treatment well    Behavior During Therapy  [facility] for tasks assessed/performed       Past Medical History:  Diagnosis Date  . Hyperprolactinemia ([place]) 11/09/2019    No past surgical history on file.  There were no vitals filed for this visit.  Subjective Assessment - 12/11/19 0859    Subjective  Pt states that she has discomfort not pain her mid back, she has been doing her home exercises, which has been makingher feel a "little better", stated increase in pain to 5/10 while doing standing trunk exercises    Currently in Pain?  No/denies    Pain Score  0-No pain                       [facility] Adult PT Treatment/Exercise - 12/11/19 0001      Exercises   Exercises  Shoulder      Lumbar Exercises: Aerobic   Elliptical  I 6, R5, 5 min    Nustep  L4x6 min      Lumbar Exercises: Standing   Other Standing Lumbar Exercises  trunk rotation w/ yellow tband, 2x10 each direction      Lumbar Exercises: Supine   Ab Set  10 reps;3 seconds    Other Supine Lumbar Exercises  throacic rotation S/L w/ 3 #   pain onset from standing     Shoulder Exercises: ROM/Strengthening   Lat Pull  1.5 plate;20 reps    Cybex Row  1.5 plate;20 reps             PT Education - 12/11/19 1022    Education Details  scap stab and trunk rotation standing    Person(s) Educated  Patient;Other (comment)    Methods   Explanation;Demonstration;Verbal cues;Handout    Comprehension  Verbalized understanding;Returned demonstration          PT Long Term Goals - 12/11/19 0948      PT LONG TERM GOAL #1   Title  Pt will be I with HEP for spine mobility and posture    Time  6    Period  Weeks    Status  Partially Met      PT LONG TERM GOAL #2   Title  Pt will be able to lift, carry items in her home without increasing back pain, using proper technique    Time  6    Period  Weeks    Status  On-going      PT LONG TERM GOAL #3   Title  Pt will be able to stand as long as she wants to with pain < 5/10.    Baseline  standing >4 hours pain 8/10    Time  6    Period  Weeks    Status  Partially Met      PT LONG TERM GOAL #  4   Title  Further goals TBA    Status  On-going            Plan - 12/11/19 0943    Clinical Impression Statement  Pt showed up 15 min late to PT, translater was present, shewas able to verbalized home exercises independently, her pain still is a 5/10 while standing, she seems to understand posture from the rows and lat pulldowns, pt tolerated treatment well.    Examination-Activity Limitations  Carry;Sit;Stand;Lift;Continence    Examination-Participation Restrictions  Yard Work;Cleaning;Laundry;Community Activity;Meal Prep;Shop    Stability/Clinical Decision Making  Stable/Uncomplicated    Rehab Potential  Good    PT Frequency  1x / week    PT Duration  6 weeks    PT Treatment/Interventions  Therapeutic exercise;Dry needling;Manual techniques;Patient/family education;Moist Heat;Electrical Stimulation;Spinal Manipulations    PT Next Visit Plan  try T-L junction release/foam roller into ext. check HEP , begin core       Patient will benefit from skilled therapeutic intervention in order to improve the following deficits and impairments:  Decreased strength, Hypomobility, Increased fascial restricitons, Decreased mobility, Impaired flexibility, Postural dysfunction  Visit  Diagnosis: Pain in thoracic spine  Bilateral low back pain without sciatica, unspecified chronicity     Problem List Patient Active Problem List   Diagnosis Date Noted  . 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 Prolactin deficiency ([place]) 11/09/2019  . Vitamin D deficiency 11/09/2019  . Low back pain 11/09/2019  . 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 Thoracic lordosis 11/09/2019  . Hyperprolactinemia ([place]) 11/09/2019   Clark,Braylin SPTA Demian Maisel,ANGIE PTA 12/11/2019, 10:22 AM  [place], [place], [address] Phone: [phone]   Fax:  [phone]  Name: Kennadi [place] MRN: [id number] Date of Birth: 08-25-1970

## 2019-12-18 ENCOUNTER — Ambulatory Visit: Payer: 59 | Attending: Adult Health Nurse Practitioner | Admitting: Physical Therapy

## 2019-12-18 ENCOUNTER — Other Ambulatory Visit: Payer: Self-pay

## 2019-12-18 DIAGNOSIS — M545 Low back pain, unspecified: Secondary | ICD-10-CM

## 2019-12-18 DIAGNOSIS — M546 Pain in thoracic spine: Secondary | ICD-10-CM | POA: Insufficient documentation

## 2019-12-18 NOTE — Therapy (Signed)
[place], [place], [address] Phone: [phone]   Fax:  [phone]  Physical Therapy Treatment  Patient Details  Name: Jody Heath MRN: [id number] Date of Birth: Dec 17, 1970 Referring Provider (PT): Dr. Jens Som   Encounter Date: 12/18/2019  PT End of Session - 12/18/19 1140    Visit Number  3    Number of Visits  6    Date for PT Re-Evaluation  01/11/20    PT Start Time  1103    PT Stop Time  1140    PT Time Calculation (min)  37 min    Activity Tolerance  Patient tolerated treatment well    Behavior During Therapy  [facility] for tasks assessed/performed       Past Medical History:  Diagnosis Date  . Hyperprolactinemia ([place]) 11/09/2019    No past surgical history on file.  There were no vitals filed for this visit.  Subjective Assessment - 12/18/19 1103    Subjective  Pt states that her pain feels a bit worse than last time, she says she has pain mostly when she stands for long periods of time, she also stated that the home exercises do help her pain    Limitations  Standing;Sitting;House hold activities    How long can you stand comfortably?  3 hours    Currently in Pain?  Yes    Pain Score  4     Pain Location  Thoracic                       [facility] Adult PT Treatment/Exercise - 12/18/19 0001      Lumbar Exercises: Aerobic   Nustep  L4 67mn      Shoulder Exercises: Standing   Extension  Strengthening;Left;20 reps;Theraband    Theraband Level (Shoulder Extension)  Level 2 (Red)    Row  Strengthening;Both;20 reps;Theraband   cues for upright posture   Theraband Level (Shoulder Row)  Level 2 (Red)    Other Standing Exercises  shld flex w/ ball 3x10, rotation with ball 2x10    Other Standing Exercises  shld flex and abd 15x, 2#   fatigue w/ abd     Shoulder Exercises: Pulleys   Other Pulley Exercises  iosmetric abs presses and circles, 2x10                   PT Long Term Goals - 12/18/19 1158      PT LONG TERM GOAL #1   Title  Pt will be I with HEP for spine mobility and posture    Time  6    Period  Weeks    Status  Achieved      PT LONG TERM GOAL #2   Title  Pt will be able to lift, carry items in her home without increasing back pain, using proper technique    Status  On-going      PT LONG TERM GOAL #3   Title  Pt will be able to stand as long as she wants to with pain < 5/10.    Baseline  standing    Status  Partially Met            Plan - 12/18/19 1141    Clinical Impression Statement  Pt is doing good ,interpreter was present, she has been doing her home exercises and she reported the exercises actually helped her pain, postural cues needed with the  standing rows not to lean back fatigue with the standing shld abd exercises, she performed the exercises well with min pain.    Examination-Activity Limitations  Carry;Sit;Stand;Lift;Continence    Rehab Potential  Good    PT Frequency  1x / week    PT Duration  6 weeks    PT Treatment/Interventions  Therapeutic exercise;Dry needling;Manual techniques;Patient/family education;Moist Heat;Electrical Stimulation;Spinal Manipulations    PT Next Visit Plan  try T-L junction release/foam roller into ext. check HEP , begin core       Patient will benefit from skilled therapeutic intervention in order to improve the following deficits and impairments:  Decreased strength, Hypomobility, Increased fascial restricitons, Decreased mobility, Impaired flexibility, Postural dysfunction  Visit Diagnosis: Pain in thoracic spine  Bilateral low back pain without sciatica, unspecified chronicity     Problem List Patient Active Problem List   Diagnosis Date Noted  . 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 Prolactin deficiency ([place]) 11/09/2019  . Vitamin D deficiency 11/09/2019  . Low back pain 11/09/2019  . Facet arthropathy,  multilevel 11/09/2019  . Thoracic lordosis 11/09/2019  . Hyperprolactinemia ([place]) 11/09/2019    Clarene Essex, SPTA 12/18/2019, 12:02 PM  [place], [place], [address] Phone: [phone]   Fax:  [phone]  Name: Jody Heath MRN: [id number] Date of Birth: 02/17/1971

## 2019-12-21 ENCOUNTER — Ambulatory Visit (INDEPENDENT_AMBULATORY_CARE_PROVIDER_SITE_OTHER): Payer: 59 | Admitting: Family Medicine

## 2019-12-21 ENCOUNTER — Other Ambulatory Visit: Payer: Self-pay

## 2019-12-21 ENCOUNTER — Encounter: Payer: Self-pay | Admitting: Family Medicine

## 2019-12-21 ENCOUNTER — Ambulatory Visit: Payer: 59 | Admitting: Adult Health Nurse Practitioner

## 2019-12-21 VITALS — BP 101/65 | HR 68 | Temp 97.7°F | Ht 62.0 in | Wt 168.0 lb

## 2019-12-21 DIAGNOSIS — M549 Dorsalgia, unspecified: Secondary | ICD-10-CM | POA: Diagnosis not present

## 2019-12-21 DIAGNOSIS — R10814 Left lower quadrant abdominal tenderness: Secondary | ICD-10-CM | POA: Diagnosis not present

## 2019-12-21 DIAGNOSIS — Z8639 Personal history of other endocrine, nutritional and metabolic disease: Secondary | ICD-10-CM | POA: Diagnosis not present

## 2019-12-21 NOTE — Progress Notes (Signed)
Patient ID: Jody Heath, female    DOB: Oct 23, 1970  Age: 49 y.o. MRN: [id number]  Chief Complaint  Patient presents with  . Medical Management of Chronic Issues    6 week f/u on back pain ans alopecia which is both better    Subjective:   Pleasant 49 year old [place] American lady who has been having problems with her back pain.  Physical therapy has helped.  She still has a little pain in the left scapular area of her back and she has a little tenderness in her left side of her abdomen.  Bowels act intermittently depending on how much exercise she gets.  She is scheduled to have an MRI for evaluation of her elevated prolactin by another physician.  She is married and has a couple of teenage children.  She is generally healthy and active and able to do her housework.  Current allergies, medications, problem list, past/family and social histories reviewed.  Objective:  BP 101/65 (BP Location: Right Arm, Patient Position: Sitting, Cuff Size: Normal)   Pulse 68   Temp 97.7 F (36.5 C) (Temporal)   Ht 5\' 2"  (1.575 m)   Wt 168 lb (76.2 kg)   SpO2 97%   BMI 30.73 kg/m   No major acute distress.  Back has good range of motion with no major tenderness.  Minimal tenderness below the left scapula.  Abdomen normal bowel sounds, soft without masses but has a little tenderness along the left low quadrant and sigmoid region.  Possibly a little stool palpable in the colon.  Reviewed her surgical reports that she brought with her from [facility] laboratory in [place].  She had surgery on June 16, 2016.  She had right ovarian adhesions, right partial ovary, right ovarian adhesions, appendectomy, and retroperitoneal cyst and partial left adrenal gland removals done.  Assessment & Plan:   Assessment: 1. Mid back pain   2. Left lower quadrant abdominal tenderness without rebound tenderness   3. History of hyperprolactinemia       Plan: See instructions  No  orders of the defined types were placed in this encounter.   No orders of the defined types were placed in this encounter.        Patient Instructions    On exam and looking in your chart it appears that you are fairly healthy.  However you do have a little tenderness in the left side and lower abdomen.  If there is stool backing up in your colon it can sometimes cause more back pains.  Therefore would like you to get some over-the-counter MiraLAX and take a dose every few days as necessary to keep your bowels moving a little bit more frequently.  If they become too loose on this medication, you can try taking just a half of a dose.  If you continue to have tenderness in the left side of the abdomen and pains in the back you might need to get reexamined to consider having a colon study exam done.  At age 51 you should have a colonoscopy anyhow.  Make sure you follow through with getting the MRI to check on the source of the prolactin hormone that you have made excessively.  It is fairly safe to take Tylenol (acetaminophen, paracetamol) 500 mg 2 pills twice daily if needed for the back pain.  Return as necessary   If you have lab work done today you will be contacted with your lab results within the next 2 weeks.  If  you have not heard from [place] then please contact us. The fastest way to get your results is to register for My Chart.   IF you received an x-ray today, you will receive an invoice from [facility]. Please contact [facility] at [phone] with questions or concerns regarding your invoice.   IF you received labwork today, you will receive an invoice from [facility]. Please contact [facility] at [phone] with questions or concerns regarding your invoice.   Our billing staff will not be able to assist you with questions regarding bills from these companies.  You will be contacted with the lab results as soon as they are available. The fastest way to get your  results is to activate your My Chart account. Instructions are located on the last page of this paperwork. If you have not heard from [place] regarding the results in 2 weeks, please contact this office.         No follow-ups on file.   Ruben Reason, MD 12/21/2019

## 2019-12-21 NOTE — Patient Instructions (Addendum)
  On exam and looking in your chart it appears that you are fairly healthy.  However you do have a little tenderness in the left side and lower abdomen.  If there is stool backing up in your colon it can sometimes cause more back pains.  Therefore would like you to get some over-the-counter MiraLAX and take a dose every few days as necessary to keep your bowels moving a little bit more frequently.  If they become too loose on this medication, you can try taking just a half of a dose.  If you continue to have tenderness in the left side of the abdomen and pains in the back you might need to get reexamined to consider having a colon study exam done.  At age 49 you should have a colonoscopy anyhow.  Make sure you follow through with getting the MRI to check on the source of the prolactin hormone that you have made excessively.  It is fairly safe to take Tylenol (acetaminophen, paracetamol) 500 mg 2 pills twice daily if needed for the back pain.  Return as necessary   If you have lab work done today you will be contacted with your lab results within the next 2 weeks.  If you have not heard from [place] then please contact us. The fastest way to get your results is to register for My Chart.   IF you received an x-ray today, you will receive an invoice from [facility]. Please contact [facility] at [phone] with questions or concerns regarding your invoice.   IF you received labwork today, you will receive an invoice from [facility]. Please contact [facility] at [phone] with questions or concerns regarding your invoice.   Our billing staff will not be able to assist you with questions regarding bills from these companies.  You will be contacted with the lab results as soon as they are available. The fastest way to get your results is to activate your My Chart account. Instructions are located on the last page of this paperwork. If you have not heard from [place] regarding the results in 2  weeks, please contact this office.

## 2019-12-25 ENCOUNTER — Ambulatory Visit: Payer: 59 | Admitting: Physical Therapy

## 2019-12-25 ENCOUNTER — Other Ambulatory Visit: Payer: Self-pay

## 2019-12-25 ENCOUNTER — Ambulatory Visit
Admission: RE | Admit: 2019-12-25 | Discharge: 2019-12-25 | Disposition: A | Discharge status: Home / Self Care | Payer: 59 | Source: Ambulatory Visit | Attending: Endocrinology | Admitting: Endocrinology

## 2019-12-25 DIAGNOSIS — M545 Low back pain, unspecified: Secondary | ICD-10-CM

## 2019-12-25 DIAGNOSIS — E221 Hyperprolactinemia: Secondary | ICD-10-CM

## 2019-12-25 DIAGNOSIS — M546 Pain in thoracic spine: Secondary | ICD-10-CM

## 2019-12-25 MED ORDER — GADOBENATE DIMEGLUMINE 529 MG/ML IV SOLN
8.0000 mL | Freq: Once | INTRAVENOUS | Status: AC | PRN
  Administered 2019-12-25: 8 mL via INTRAVENOUS

## 2019-12-25 NOTE — Therapy (Signed)
[address] [place], [place], [address] Phone: [phone]   Fax:  [phone]  Physical Therapy Treatment  Patient Details  Name: Jody Heath MRN: [id number] Date of Birth: 1970-12-08 Referring Provider (PT): Dr. Jens Som   Encounter Date: 12/25/2019  PT End of Session - 12/25/19 0910    Visit Number  4    Number of Visits  6    Date for PT Re-Evaluation  01/11/20    PT Start Time  0845    PT Stop Time  0923    PT Time Calculation (min)  38 min       Past Medical History:  Diagnosis Date  . Hyperprolactinemia ([place]) 11/09/2019    No past surgical history on file.  There were no vitals filed for this visit.  Subjective Assessment - 12/25/19 0850    Subjective  pt sttaes overall 60% better. pt states pain at times a 4 and can increase to a 8 with too much actvity or standing    Patient is accompained by:  Interpreter    Currently in Pain?  Yes    Pain Score  4     Pain Location  Thoracic    Pain Orientation  Left;Upper                       [facility] Adult PT Treatment/Exercise - 12/25/19 0001      Lumbar Exercises: Aerobic   UBE (Upper Arm Bike)  L 3 3 fwd/3 back      Shoulder Exercises: Standing   Other Standing Exercises  cable pulley ext and row 2 ways 10x   red tband 3 pt rhy stab on wall 10 reps   Other Standing Exercises  ball vs wall 5 x CC and CCW      Manual Therapy   Manual Therapy  Soft tissue mobilization    Manual therapy comments  rhom stretch in door frame and TP release using door frame    Soft tissue mobilization  left rhomboid,tender with TP                  PT Long Term Goals - 12/25/19 0903      PT LONG TERM GOAL #1   Title  Pt will be I with HEP for spine mobility and posture    Status  Achieved      PT LONG TERM GOAL #2   Title  Pt will be able to lift, carry items in her home without increasing back pain, using proper  technique    Status  Partially Met      PT LONG TERM GOAL #3   Title  Pt will be able to stand as long as she wants to with pain < 5/10.    Status  Partially Met            Plan - 12/25/19 0918    Clinical Impression Statement  after 25 min of ex pt had no pain just fatigue. educ on proper poster and ADL fo lifting and carrying. educ on stretching for TP in left rhom and asked to do when feels symptoms/pain to keep them from increasing to higher pian. proressing with goals    PT Treatment/Interventions  Therapeutic exercise;Dry needling;Manual techniques;Patient/family education;Moist Heat;Electrical Stimulation;Spinal Manipulations    PT Next Visit Plan  assess pain and management of symptoms at home- look to D/C next session  Patient will benefit from skilled therapeutic intervention in order to improve the following deficits and impairments:  Decreased strength, Hypomobility, Increased fascial restricitons, Decreased mobility, Impaired flexibility, Postural dysfunction  Visit Diagnosis: Bilateral low back pain without sciatica, unspecified chronicity  Pain in thoracic spine     Problem List Patient Active Problem List   Diagnosis Date Noted  . 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 Prolactin deficiency ([place]) 11/09/2019  . Vitamin D deficiency 11/09/2019  . Low back pain 11/09/2019  . 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 Thoracic lordosis 11/09/2019  . Hyperprolactinemia ([place]) 11/09/2019    Rutherford Alarie,ANGIE PTA 12/25/2019, 9:24 AM  [address] [place], [place], [address] Phone: [phone]   Fax:  [phone]  Name: Jody Heath MRN: [id number] Date of Birth: 1970/08/28

## 2019-12-31 ENCOUNTER — Other Ambulatory Visit: Payer: Self-pay

## 2019-12-31 ENCOUNTER — Other Ambulatory Visit (INDEPENDENT_AMBULATORY_CARE_PROVIDER_SITE_OTHER): Payer: 59

## 2019-12-31 DIAGNOSIS — E221 Hyperprolactinemia: Secondary | ICD-10-CM

## 2019-12-31 LAB — T4, FREE: Free T4: 0.74 ng/dL (ref 0.60–1.60)

## 2020-01-01 LAB — PROLACTIN: Prolactin: 13.3 ng/mL (ref 4.8–23.3)

## 2020-01-03 ENCOUNTER — Other Ambulatory Visit: Payer: Self-pay

## 2020-01-03 ENCOUNTER — Ambulatory Visit (INDEPENDENT_AMBULATORY_CARE_PROVIDER_SITE_OTHER): Payer: 59 | Admitting: Endocrinology

## 2020-01-03 ENCOUNTER — Encounter: Payer: Self-pay | Admitting: Endocrinology

## 2020-01-03 VITALS — BP 120/80 | HR 67 | Ht 62.0 in | Wt 168.2 lb

## 2020-01-03 DIAGNOSIS — D352 Benign neoplasm of pituitary gland: Secondary | ICD-10-CM | POA: Diagnosis not present

## 2020-01-03 MED ORDER — CABERGOLINE 0.5 MG PO TABS: 0.2500 mg | ORAL_TABLET | ORAL | 0 refills | Status: DC

## 2020-01-03 MED ORDER — VITAMIN D (ERGOCALCIFEROL) 1.25 MG (50000 UNIT) PO CAPS: 50000.0000 [IU] | ORAL_CAPSULE | ORAL | 0 refills | Status: AC

## 2020-01-03 NOTE — Progress Notes (Signed)
Patient ID: Jody Heath, female   DOB: 1971/04/09, 49 y.o.   MRN: [id number]             Referring PCP: Wendall Mola, NP  Chief complaint: High prolactin  History of Present Illness  Prior history: She apparently had problems with milk discharge from her breasts and 2018 and was found to have high prolactin level This was done by a physician in [place] and no records are available She does not know what her prolactin level was or what her MRI scan showed She thinks that with taking cabergoline half tablet twice a week her prolactin level had come down and after about 1-1/2 years the medication was stopped but she did not have a follow-up subsequently  Recent history:  Although she was appearing asymptomatic her prolactin level was 109 She had not complained of any spontaneous milky secretions from her breasts She has had a hysterectomy a few years ago Also does not complain of any unusual headaches or change in vision  She does not feel any different with restarting cabergoline 0.25 mg twice a week which she takes on Mondays and Thursdays She has no nausea or dizziness with this  As below her prolactin level is back to normal MRI of pituitary gland was also done as follows  Prolactin levels:  Lab Results  Component Value Date   PROLACTIN 13.3 12/31/2019   PROLACTIN 109.0 (H) 11/02/2019    MRI of pituitary gland, 12/26/2019:  Asymmetric pituitary gland mostly flattened against the sellar floor with asymmetric contrast enhancement along the medial wall of the left cavernous sinus.  No well defined hypoenhancing lesion identified.   Report from 2018 not available  She has no other evidence of hypopituitarism, no recent fatigue  Lab Results  Component Value Date   TSH 3.930 11/02/2019   FREET4 0.74 12/31/2019     Allergies as of 01/03/2020   No Known Allergies     Medication List       Accurate as of Jan 03, 2020 10:56 AM. If you have any  questions, ask your nurse or doctor.        cabergoline 0.5 MG tablet Commonly known as: DOSTINEX Take 0.5 tablets (0.25 mg total) by mouth 2 (two) times a week.   Vitamin D (Ergocalciferol) 1.25 MG (50000 UNIT) Caps capsule Commonly known as: DRISDOL Take 50,000 Units by mouth every 7 (seven) days.       Allergies: No Known Allergies  Past Medical History:  Diagnosis Date  . Hyperprolactinemia ([place]) 11/09/2019    No past surgical history on file.  Family History  Problem Relation Age of Onset  . Diabetes Mother   . Stroke Paternal Uncle     Social History:  reports that she has never smoked. She has never used smokeless tobacco. No history on file for alcohol and drug.   Review of Systems   She has vitamin D deficiency on routine testing and is being treated by PCP with prescription vitamin D2  EXAM:  There were no vitals taken for this visit.  Physical Exam   ASSESSMENT:     Prolactinoma with baseline hyperprolactinemia with a level of 109  She has minimal symptoms although previously had galactorrhea Unable to assess her menstrual cycles because of hysterectomy MRI of the pituitary gland suggests empty sella syndrome with some enhancing of the medial part of the pituitary gland without obvious microadenoma  With Dostinex she has had no side effects and is taking 0.25  mg twice a week regularly Prolactin is back to normal at 13  Discussed prolactinoma, effects of hyperprolactinemia and showed her images of normal pituitary gland    PLAN:     Continue Dostinex 0.5 mg, half tablet twice a week Follow-up in 3 months If her prolactin is coming down progressively the cabergoline may be reduced but otherwise likely she will need to continue this until she is in menopause     Elayne Snare 01/03/20   Note: This office note was prepared with Dragon voice recognition system technology. Any transcriptional errors that result from this process are  unintentional.

## 2020-01-08 ENCOUNTER — Ambulatory Visit: Payer: 59 | Admitting: Physical Therapy

## 2020-01-22 ENCOUNTER — Other Ambulatory Visit: Payer: Self-pay

## 2020-01-22 ENCOUNTER — Ambulatory Visit (INDEPENDENT_AMBULATORY_CARE_PROVIDER_SITE_OTHER): Payer: 59 | Admitting: Registered Nurse

## 2020-01-22 ENCOUNTER — Encounter: Payer: Self-pay | Admitting: Registered Nurse

## 2020-01-22 VITALS — BP 103/64 | HR 78 | Temp 97.1°F | Resp 18 | Ht 62.0 in | Wt 168.0 lb

## 2020-01-22 DIAGNOSIS — Z7689 Persons encountering health services in other specified circumstances: Secondary | ICD-10-CM | POA: Diagnosis not present

## 2020-01-22 DIAGNOSIS — E559 Vitamin D deficiency, unspecified: Secondary | ICD-10-CM | POA: Diagnosis not present

## 2020-01-22 NOTE — Patient Instructions (Signed)
° ° ° °  If you have lab work done today you will be contacted with your lab results within the next 2 weeks.  If you have not heard from us then please contact us. The fastest way to get your results is to register for My Chart. ° ° °IF you received an x-ray today, you will receive an invoice from [facility]. Please contact [facility] at [phone] with questions or concerns regarding your invoice.  ° °IF you received labwork today, you will receive an invoice from [facility]. Please contact [facility] at [phone] with questions or concerns regarding your invoice.  ° °Our billing staff will not be able to assist you with questions regarding bills from these companies. ° °You will be contacted with the lab results as soon as they are available. The fastest way to get your results is to activate your My Chart account. Instructions are located on the last page of this paperwork. If you have not heard from us regarding the results in 2 weeks, please contact this office. °  ° ° ° °

## 2020-02-25 ENCOUNTER — Other Ambulatory Visit: Payer: Self-pay | Admitting: Endocrinology

## 2020-03-25 ENCOUNTER — Other Ambulatory Visit: Payer: Self-pay | Admitting: Endocrinology

## 2020-03-25 NOTE — Telephone Encounter (Signed)
Patient is requesting refill for Vitamin D2 50,000 units. Follow up appointment scheduled for 04/15/20. Please advise.

## 2020-03-25 NOTE — Telephone Encounter (Signed)
This is being prescribed by her PCP

## 2020-04-11 ENCOUNTER — Other Ambulatory Visit: Payer: 59

## 2020-04-15 ENCOUNTER — Ambulatory Visit: Payer: 59 | Admitting: Endocrinology

## 2020-04-23 ENCOUNTER — Other Ambulatory Visit: Payer: 59

## 2020-04-28 ENCOUNTER — Ambulatory Visit: Payer: 59 | Admitting: Endocrinology

## 2020-05-05 NOTE — Progress Notes (Signed)
Established Patient Office Visit  Subjective:  Patient ID: Jody Heath, female    DOB: [birth year]  Age: 49 y.o. MRN: [id number]  CC:  Chief Complaint  Patient presents with  . Transitions Of Care    patient states she takes vitamin D and [place] and needs to know if she needs more or change the dosage     HPI Jody Heath presents for medication question and TOC  Formerly a patient of Jens Som  Histories reviewed with patient  Going to travel to [place] soon - is curious as to whether or not she needs to increase her dose of Vitamin D. Hx of deficiency. Has been on 50,000 units once weekly. Last check was 11/02/19 at 21.5 ng/mL.  No symptoms of deficiency. No further concerns   Past Medical History:  Diagnosis Date  . Anemia    Phreesia 01/19/2020  . Hyperprolactinemia ([place]) 11/09/2019    Past Surgical History:  Procedure Laterality Date  . ABDOMINAL HYSTERECTOMY    . ADRENALECTOMY    . APPENDECTOMY N/A    Phreesia 01/19/2020  . CESAREAN SECTION N/A    Phreesia 01/19/2020    Family History  Problem Relation Age of Onset  . Diabetes Mother   . Stroke Mother   . Stroke Paternal Uncle   . Kidney failure Father     Social History   Socioeconomic History  . Marital status: Married    Spouse name: Not on file  . Number of children: 2  . Years of education: Not on file  . Highest education level: Not on file  Occupational History  . Not on file  Tobacco Use  . Smoking status: Never Smoker  . Smokeless tobacco: Never Used  Vaping Use  . Vaping Use: Never used  Substance and Sexual Activity  . Alcohol use: Yes    Comment: occ  . Drug use: Never  . Sexual activity: Not Currently  Other Topics Concern  . Not on file  Social History Narrative  . Not on file   Social Determinants of Health   Financial Resource Strain:   . Difficulty of Paying Living Expenses: Not on file  Food Insecurity:   . Worried About Sales executive in the Last Year: Not on file  . Ran Out of Food in the Last Year: Not on file  Transportation Needs:   . Lack of Transportation (Medical): Not on file  . Lack of Transportation (Non-Medical): Not on file  Physical Activity:   . Days of Exercise per Week: Not on file  . Minutes of Exercise per Session: Not on file  Stress:   . Feeling of Stress : Not on file  Social Connections:   . Frequency of Communication with Friends and Family: Not on file  . Frequency of Social Gatherings with Friends and Family: Not on file  . Attends Religious Services: Not on file  . Active Member of Clubs or Organizations: Not on file  . Attends Archivist Meetings: Not on file  . Marital Status: Not on file  Intimate Partner Violence:   . Fear of Current or Ex-Partner: Not on file  . Emotionally Abused: Not on file  . Physically Abused: Not on file  . Sexually Abused: Not on file    Outpatient Medications Prior to Visit  Medication Sig Dispense Refill  . Vitamin D, Ergocalciferol, (DRISDOL) 1.25 MG (50000 UNIT) CAPS capsule Take 1 capsule (50,000 Units total) by mouth  every 7 (seven) days. 12 capsule 0  . cabergoline (DOSTINEX) 0.5 MG tablet Take 0.5 tablets (0.25 mg total) by mouth 2 (two) times a week. 30 tablet 0   No facility-administered medications prior to visit.    Allergies  Allergen Reactions  . Iodine Hives    ROS Review of Systems  Constitutional: Negative.   HENT: Negative.   Eyes: Negative.   Respiratory: Negative.   Cardiovascular: Negative.   Gastrointestinal: Negative.   Genitourinary: Negative.   Musculoskeletal: Negative.   Skin: Negative.   Neurological: Negative.   Psychiatric/Behavioral: Negative.       Objective:    Physical Exam Vitals and nursing note reviewed.  Constitutional:      General: She is not in acute distress.    Appearance: Normal appearance. She is normal weight. She is not ill-appearing, toxic-appearing or diaphoretic.    Cardiovascular:     Rate and Rhythm: Normal rate and regular rhythm.     Heart sounds: Normal heart sounds. No murmur heard.  No friction rub. No gallop.   Pulmonary:     Effort: Pulmonary effort is normal. No respiratory distress.     Breath sounds: Normal breath sounds. No stridor. No wheezing, rhonchi or rales.  Chest:     Chest wall: No tenderness.  Skin:    General: Skin is warm and dry.  Neurological:     General: No focal deficit present.     Mental Status: She is alert and oriented to person, place, and time. Mental status is at baseline.  Psychiatric:        Mood and Affect: Mood normal.        Behavior: Behavior normal.        Thought Content: Thought content normal.        Judgment: Judgment normal.     BP 103/64   Pulse 78   Temp (!) 97.1 F (36.2 C) (Temporal)   Resp 18   Ht 5\' 2"  (1.575 m)   Wt 168 lb (76.2 kg)   SpO2 97%   BMI 30.73 kg/m  Wt Readings from Last 3 Encounters:  01/22/20 168 lb (76.2 kg)  01/03/20 168 lb 3.2 oz (76.3 kg)  12/21/19 168 lb (76.2 kg)     Health Maintenance Due  Topic Date Due  . Hepatitis C Screening  Never done  . PAP SMEAR-Modifier  Never done  . INFLUENZA VACCINE  Never done    There are no preventive care reminders to display for this patient.  Lab Results  Component Value Date   TSH 3.930 11/02/2019   Lab Results  Component Value Date   WBC 3.1 (L) 11/02/2019   HGB 15.4 11/02/2019   HCT 44.7 11/02/2019   MCV 89 11/02/2019   PLT 156 11/02/2019   Lab Results  Component Value Date   NA 140 11/02/2019   K 4.3 11/02/2019   CO2 20 11/02/2019   GLUCOSE 95 11/02/2019   BUN 7 11/02/2019   CREATININE 0.75 11/02/2019   BILITOT 0.5 11/02/2019   ALKPHOS 66 11/02/2019   AST 24 11/02/2019   ALT 22 11/02/2019   PROT 7.8 11/02/2019   ALBUMIN 4.8 11/02/2019   CALCIUM 10.1 11/02/2019   Lab Results  Component Value Date   CHOL 196 11/02/2019   Lab Results  Component Value Date   HDL 51 11/02/2019   Lab  Results  Component Value Date   LDLCALC 125 (H) 11/02/2019   Lab Results  Component Value Date  TRIG 112 11/02/2019   Lab Results  Component Value Date   CHOLHDL 3.8 11/02/2019   No results found for: HGBA1C    Assessment & Plan:   Problem List Items Addressed This Visit      Other   Vitamin D deficiency (Chronic)    Other Visit Diagnoses    Encounter to establish care    -  Primary      No orders of the defined types were placed in this encounter.   Follow-up: No follow-ups on file.   PLAN  Encouraged patient that with most recent Vit D level, she should consider daily supplement of 2000 units rather than 50,000 units daily. Encouraged calcium and Vit D rich foods, weight bearing exercise, and sun exposure as tolerated  Return in 3-6 months for recheck on Vit D levels  Patient encouraged to call clinic with any questions, comments, or concerns.  Maximiano Coss, NP

## 2020-05-07 ENCOUNTER — Other Ambulatory Visit (INDEPENDENT_AMBULATORY_CARE_PROVIDER_SITE_OTHER): Payer: 59

## 2020-05-07 ENCOUNTER — Other Ambulatory Visit: Payer: Self-pay

## 2020-05-07 DIAGNOSIS — D352 Benign neoplasm of pituitary gland: Secondary | ICD-10-CM | POA: Diagnosis not present

## 2020-05-08 LAB — PROLACTIN: Prolactin: 11.5 ng/mL (ref 4.8–23.3)

## 2020-05-09 ENCOUNTER — Encounter: Payer: Self-pay | Admitting: Endocrinology

## 2020-05-09 ENCOUNTER — Other Ambulatory Visit: Payer: Self-pay

## 2020-05-09 ENCOUNTER — Ambulatory Visit (INDEPENDENT_AMBULATORY_CARE_PROVIDER_SITE_OTHER): Payer: 59 | Admitting: Endocrinology

## 2020-05-09 VITALS — BP 102/64 | HR 72 | Ht 62.0 in | Wt 161.0 lb

## 2020-05-09 DIAGNOSIS — D352 Benign neoplasm of pituitary gland: Secondary | ICD-10-CM | POA: Diagnosis not present

## 2020-05-09 NOTE — Progress Notes (Signed)
Patient ID: Jody Heath, female   DOB: 1971/02/29, 49 y.o.   MRN: [id number]              Chief complaint: High prolactin  History of Present Illness  Prior history: She apparently had problems with milk discharge from her breasts and 2018 and was found to have high prolactin level This was done by a physician in [place] and no records are available She does not know what her prolactin level was or what her MRI scan showed She thinks that with taking cabergoline half tablet twice a week her prolactin level had come down and after about 1-1/2 years the medication was stopped but she did not have a follow-up subsequently  Recent history:  In 3/21 her prolactin level was 109 on her initial visit She had not complained of any spontaneous milky secretions from her breasts at that time She has had a hysterectomy and cannot assess menstrual cycles  Because of significant hyperprolactinemia she was started on cabergoline in 4/21 She has been continued on cabergoline 0.25 mg twice a week which she takes on Mondays and Thursdays She has no nausea or dizziness as side effects She did miss her medication for the second half of August but resumed it subsequently  Again her prolactin level is fairly normal Since previous MRI was not available repeat imaging was done MRI of pituitary gland showed the following results  Prolactin levels:  Lab Results  Component Value Date   PROLACTIN 11.5 05/07/2020   PROLACTIN 13.3 12/31/2019   PROLACTIN 109.0 (H) 11/02/2019    MRI of pituitary gland, 12/26/2019:  Asymmetric pituitary gland mostly flattened against the sellar floor with asymmetric contrast enhancement along the medial wall of the left cavernous sinus.  No well defined hypoenhancing lesion identified.   Report from 2018 not available  She has no other evidence of hypopituitarism  Lab Results  Component Value Date   TSH 3.930 11/02/2019   FREET4 0.74 12/31/2019      Allergies as of 05/09/2020      Reactions   Iodine Hives      Medication List       Accurate as of May 09, 2020 10:06 AM. If you have any questions, ask your nurse or doctor.        cabergoline 0.5 MG tablet Commonly known as: DOSTINEX TAKE 0.5 TABLETS (0.25 MG TOTAL) BY MOUTH 2 (TWO) TIMES A WEEK.   Vitamin D (Ergocalciferol) 1.25 MG (50000 UNIT) Caps capsule Commonly known as: DRISDOL Take 1 capsule (50,000 Units total) by mouth every 7 (seven) days.       Allergies:  Allergies  Allergen Reactions  . Iodine Hives    Past Medical History:  Diagnosis Date  . Anemia    Phreesia 01/19/2020  . Hyperprolactinemia ([place]) 11/09/2019    Past Surgical History:  Procedure Laterality Date  . ABDOMINAL HYSTERECTOMY    . ADRENALECTOMY    . APPENDECTOMY N/A    Phreesia 01/19/2020  . CESAREAN SECTION N/A    Phreesia 01/19/2020    Family History  Problem Relation Age of Onset  . Diabetes Mother   . Stroke Mother   . Stroke Paternal Uncle   . Kidney failure Father     Social History:  reports that she has never smoked. She has never used smokeless tobacco. She reports current alcohol use. She reports that she does not use drugs.   Review of Systems She has vitamin D deficiency on routine testing and is  being treated by PCP with prescription vitamin D2  EXAM:  BP 102/64   Pulse 72   Ht 5\' 2"  (1.575 m)   Wt 161 lb (73 kg)   SpO2 98%   BMI 29.45 kg/m   Physical Exam   ASSESSMENT:     Prolactinoma with baseline hyperprolactinemia with a level of 109  She has minimal symptoms although previously had galactorrhea She has personally not checked for galactorrhea  Unable to assess her menstrual cycles because of hysterectomy MRI of the pituitary gland in 5/21 suggested empty sella syndrome with some enhancing of the medial part of the pituitary gland without obvious microadenoma  With Dostinex she has had normalization of her prolactin  consistently  She has no side effects with taking 0.25 mg twice a week regularly Prolactin is back to normal at 13     PLAN:     Continue Dostinex 0.5 mg, half tablet twice a week Follow-up in 3 months She may need continued treatment until menopause since she does not have an obvious pituitary adenoma She will also try to get previous reports of MRI from [place] 05/09/20   Note: This office note was prepared with Dragon voice recognition system technology. Any transcriptional errors that result from this process are unintentional.

## 2020-06-09 ENCOUNTER — Other Ambulatory Visit: Payer: Self-pay | Admitting: Endocrinology

## 2020-08-31 ENCOUNTER — Other Ambulatory Visit: Payer: Self-pay | Admitting: Endocrinology

## 2020-09-01 ENCOUNTER — Ambulatory Visit: Payer: 59 | Admitting: Endocrinology

## 2020-09-01 ENCOUNTER — Other Ambulatory Visit: Payer: 59

## 2020-09-03 ENCOUNTER — Ambulatory Visit: Payer: 59 | Admitting: Endocrinology

## 2020-10-23 ENCOUNTER — Other Ambulatory Visit (INDEPENDENT_AMBULATORY_CARE_PROVIDER_SITE_OTHER): Payer: 59

## 2020-10-23 ENCOUNTER — Other Ambulatory Visit: Payer: Self-pay

## 2020-10-23 DIAGNOSIS — D352 Benign neoplasm of pituitary gland: Secondary | ICD-10-CM

## 2020-10-24 LAB — PROLACTIN: Prolactin: 3.4 ng/mL — ABNORMAL LOW (ref 4.8–23.3)

## 2020-10-28 ENCOUNTER — Other Ambulatory Visit: Payer: Self-pay

## 2020-10-28 ENCOUNTER — Ambulatory Visit (INDEPENDENT_AMBULATORY_CARE_PROVIDER_SITE_OTHER): Payer: 59 | Admitting: Endocrinology

## 2020-10-28 ENCOUNTER — Encounter: Payer: Self-pay | Admitting: Endocrinology

## 2020-10-28 VITALS — BP 112/68 | HR 78 | Resp 20 | Ht 62.0 in | Wt 161.8 lb

## 2020-10-28 DIAGNOSIS — D352 Benign neoplasm of pituitary gland: Secondary | ICD-10-CM | POA: Diagnosis not present

## 2020-10-28 DIAGNOSIS — E221 Hyperprolactinemia: Secondary | ICD-10-CM

## 2020-10-28 NOTE — Patient Instructions (Signed)
1/2 tab weekly

## 2020-10-28 NOTE — Progress Notes (Signed)
Patient ID: Jody Heath, female   DOB: [birth year], 50 y.o.   MRN: [id number]              Chief complaint: High prolactin  History of Present Illness  Prior history: She apparently had problems with milk discharge from her breasts and 2018 and was found to have high prolactin level This was done by a physician in [place] and no records are available She does not know what her prolactin level was or what her MRI scan showed She thinks that with taking cabergoline half tablet twice a week her prolactin level had come down and after about 1-1/2 years the medication was stopped but she did not have a follow-up subsequently  Recent history:  In 3/21 her prolactin level was 109 on her initial visit She had not complained of any spontaneous milky secretions from her breasts at that time  She has had a hysterectomy and cannot assess menstrual cycles  Because of significant hyperprolactinemia she was started on cabergoline in 4/21 She has been continued on cabergoline 0.25 mg twice a week which she takes on Mondays and Fridays currently She tolerates this well  Now her prolactin level is subnormal at 3.4, previously 1.5  Prolactin levels:  Lab Results  Component Value Date   PROLACTIN 3.4 (L) 10/23/2020   PROLACTIN 11.5 05/07/2020   PROLACTIN 13.3 12/31/2019   PROLACTIN 109.0 (H) 11/02/2019    Since previous MRI was not available repeat imaging was done MRI of pituitary gland showed the following results  MRI of pituitary gland, 12/26/2019:  Asymmetric pituitary gland mostly flattened against the sellar floor with asymmetric contrast enhancement along the medial wall of the left cavernous sinus.  No well defined hypoenhancing lesion identified.   Report from 2018 not available  She has no other evidence of hypopituitarism  Lab Results  Component Value Date   TSH 3.930 11/02/2019   FREET4 0.74 12/31/2019     Allergies as of 10/28/2020      Reactions   Iodine  Hives      Medication List       Accurate as of October 28, 2020  1:01 PM. If you have any questions, ask your nurse or doctor.        cabergoline 0.5 MG tablet Commonly known as: DOSTINEX TAKE 1/2 TABLETS (0.25 MG TOTAL) BY MOUTH 2 (TWO) TIMES A WEEK.   cholecalciferol 25 MCG (1000 UNIT) tablet Commonly known as: VITAMIN D3 Take 1,000 Units by mouth daily.   Vitamin D (Ergocalciferol) 1.25 MG (50000 UNIT) Caps capsule Commonly known as: DRISDOL Take 1 capsule (50,000 Units total) by mouth every 7 (seven) days.       Allergies:  Allergies  Allergen Reactions   Iodine Hives    Past Medical History:  Diagnosis Date   Anemia    [facility] 01/19/2020   Hyperprolactinemia ([place]) 11/09/2019    Past Surgical History:  Procedure Laterality Date   ABDOMINAL HYSTERECTOMY     ADRENALECTOMY     APPENDECTOMY N/A    [facility] 01/19/2020   CESAREAN SECTION N/A    [facility] 01/19/2020    Family History  Problem Relation Age of Onset   Diabetes Mother    Stroke Mother    Stroke Paternal Uncle    Kidney failure Father     Social History:  reports that she has never smoked. She has never used smokeless tobacco. She reports current alcohol use. She reports that she does not use drugs.   Review  of Systems She has vitamin D deficiency on routine testing and is being treated by PCP now taking OTC  No symptoms currently of hot flashes or sweating spells  EXAM:  BP 112/68 (BP Location: Left Arm, Patient Position: Sitting, Cuff Size: Normal)    Pulse 78    Resp 20    Ht 5\' 2"  (1.575 m)    Wt 161 lb 12.8 oz (73.4 kg)    SpO2 97%    BMI 29.59 kg/m   Physical Exam   ASSESSMENT:     Prolactinoma with baseline hyperprolactinemia with a level of 109  She has no recent symptoms; previously had galactorrhea  Unable to assess her menstrual cycles because of hysterectomy MRI of the pituitary gland in 5/21 suggested empty sella syndrome with some enhancing of the medial  part of the pituitary gland without obvious microadenoma  With Dostinex she has had normalization of her prolactin Now prolactin is slightly low  Currently taking 0.25 mg twice a week regularly     PLAN:     She will reduce her Dostinex to once a week Follow-up in 3 months to make sure prolactin is stable However consider stopping Dostinex completely if prolactin is still low normal   Jody Heath 10/28/20   Note: This office note was prepared with Dragon voice recognition system technology. Any transcriptional errors that result from this process are unintentional.

## 2021-01-06 ENCOUNTER — Other Ambulatory Visit (INDEPENDENT_AMBULATORY_CARE_PROVIDER_SITE_OTHER): Payer: 59

## 2021-01-06 ENCOUNTER — Other Ambulatory Visit: Payer: Self-pay

## 2021-01-06 DIAGNOSIS — E221 Hyperprolactinemia: Secondary | ICD-10-CM | POA: Diagnosis not present

## 2021-01-07 LAB — PROLACTIN: Prolactin: 15.8 ng/mL (ref 4.8–23.3)

## 2021-01-09 ENCOUNTER — Ambulatory Visit (INDEPENDENT_AMBULATORY_CARE_PROVIDER_SITE_OTHER): Payer: 59 | Admitting: Endocrinology

## 2021-01-09 ENCOUNTER — Encounter: Payer: Self-pay | Admitting: Endocrinology

## 2021-01-09 ENCOUNTER — Other Ambulatory Visit: Payer: Self-pay

## 2021-01-09 VITALS — BP 114/78 | HR 70 | Ht 62.0 in | Wt 168.0 lb

## 2021-01-09 DIAGNOSIS — E236 Other disorders of pituitary gland: Secondary | ICD-10-CM | POA: Diagnosis not present

## 2021-01-09 DIAGNOSIS — E221 Hyperprolactinemia: Secondary | ICD-10-CM | POA: Diagnosis not present

## 2021-01-09 NOTE — Progress Notes (Signed)
Patient ID: Jody Heath, female   DOB: [birth year], 50 y.o.   MRN: [id number]              Chief complaint: Follow-up of high prolactin  History of Present Illness  Prior history: She apparently had problems with milk discharge from her breasts and 2018 and was found to have high prolactin level This was done by a physician in [place] and no records are available She does not know what her prolactin level was or what her MRI scan showed She thinks that with taking cabergoline half tablet twice a week her prolactin level had come down and after about 1-1/2 years the medication was stopped but she did not have a follow-up subsequently  Recent history:  In 3/21 her prolactin level was 109 on her initial visit She had not complained of any spontaneous milky secretions from her breasts at that time  She has had a hysterectomy and cannot assess menstrual cycles  Because of significant hyperprolactinemia she was started on cabergoline in 11/2019 She has been continued on cabergoline 0.25 mg twice a week  Since her prolactin was down to 3.4 she has been only on a weekly dose of cabergoline 0.25 mg since 10/2020  She says she has not noticed any spontaneous milky secretions but she usually does not try to check for this  Now her prolactin level is 15.8, previously at 3.4,  Prolactin levels:  Lab Results  Component Value Date   PROLACTIN 15.8 01/06/2021   PROLACTIN 3.4 (L) 10/23/2020   PROLACTIN 11.5 05/07/2020   PROLACTIN 13.3 12/31/2019    Since previous MRI was not available repeat imaging was done MRI of pituitary gland showed the following results  MRI of pituitary gland, 12/26/2019:  Asymmetric pituitary gland mostly flattened against the sellar floor with asymmetric contrast enhancement along the medial wall of the left cavernous sinus.  No well defined hypoenhancing lesion identified.   Report from 2018 not available  She has no other evidence of  hypopituitarism  Lab Results  Component Value Date   TSH 3.930 11/02/2019   FREET4 0.74 12/31/2019     Allergies as of 01/09/2021      Reactions   Iodine Hives      Medication List       Accurate as of Jan 09, 2021 10:52 AM. If you have any questions, ask your nurse or doctor.        cabergoline 0.5 MG tablet Commonly known as: DOSTINEX TAKE 1/2 TABLETS (0.25 MG TOTAL) BY MOUTH 2 (TWO) TIMES A WEEK.   cholecalciferol 25 MCG (1000 UNIT) tablet Commonly known as: VITAMIN D3 Take 1,000 Units by mouth daily.   Vitamin D (Ergocalciferol) 1.25 MG (50000 UNIT) Caps capsule Commonly known as: DRISDOL Take 1 capsule (50,000 Units total) by mouth every 7 (seven) days.       Allergies:  Allergies  Allergen Reactions  . Iodine Hives    Past Medical History:  Diagnosis Date  . Anemia    [facility] 01/19/2020  . Hyperprolactinemia ([place]) 11/09/2019    Past Surgical History:  Procedure Laterality Date  . ABDOMINAL HYSTERECTOMY    . ADRENALECTOMY    . APPENDECTOMY N/A    [facility] 01/19/2020  . CESAREAN SECTION N/A    [facility] 01/19/2020    Family History  Problem Relation Age of Onset  . Diabetes Mother   . Stroke Mother   . Stroke Paternal Uncle   . Kidney failure Father     Social  History:  reports that she has never smoked. She has never used smokeless tobacco. She reports current alcohol use. She reports that she does not use drugs.   Review of Systems  Neurological: Negative for tremors.   She has vitamin D deficiency on routine testing and is being treated by PCP now taking OTC  No symptoms currently of hot flashes or sweating spells  EXAM:  BP 114/78 (BP Location: Left Arm, Patient Position: Sitting, Cuff Size: Normal)   Pulse 70   Ht 5\' 2"  (1.575 m)   Wt 168 lb (76.2 kg)   SpO2 98%   BMI 30.73 kg/m   Physical Exam   ASSESSMENT:     Prolactinoma with baseline hyperprolactinemia with a level of 109 MRI of the pituitary gland in 5/21  suggested empty sella syndrome  She is now only on 0.25 mg cabergoline weekly No galactorrhea reported Unable to assess her menstrual cycles because of hysterectomy Prolactin is normal at 15.8     PLAN:     Consider weekly Dostinex for another 6 months However consider stopping Dostinex completely if prolactin is starts getting low normal   Elayne Snare 01/09/21   Note: This office note was prepared with Dragon voice recognition system technology. Any transcriptional errors that result from this process are unintentional.

## 2021-01-09 NOTE — Patient Instructions (Signed)
Vitamin D3, 2000 units daily

## 2021-01-14 IMAGING — MR MR HEAD WO/W CM
17 of 20 series · 37 of 48 positions shown · IV contrast (multihance)
Comparison: None.

CLINICAL DATA: Pituitary adenoma, known or suspected.

EXAM:
MRI HEAD WITHOUT AND WITH CONTRAST
TECHNIQUE: Multiplanar, multiecho pulse sequences of the brain and surrounding
structures were obtained without and with intravenous contrast.
CONTRAST:  8mL MULTIHANCE GADOBENATE DIMEGLUMINE 529 MG/ML IV SOLN

[Series 2: T1 · sagittal · 5.0mm · 0.45mm/px · 1 of 21 slices shown]
[im 1/21]
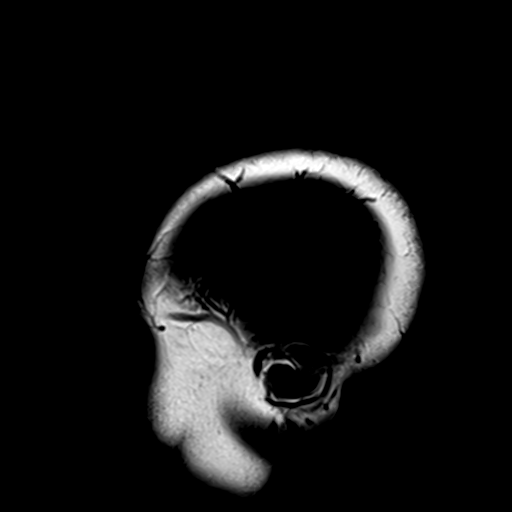

[Series 3: DWI · axial · 3.0mm · 1.80mm/px · z∈[-56,+91]mm · 9 of 100 slices shown]
[im 1/100]
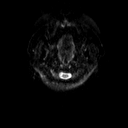
[im 13/100]
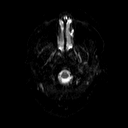
[im 25/100]
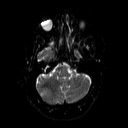
[im 38/100]
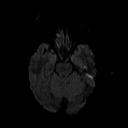
[im 50/100]
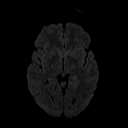
[im 62/100]
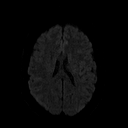
[im 75/100]
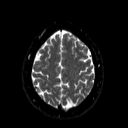
[im 87/100]
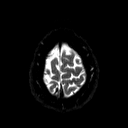
[im 100/100]
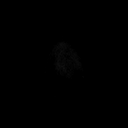

[Series 4: dwi_adc · axial · 3.0mm · 1.80mm/px · z∈[-56,+91]mm · 5 of 48 slices shown]
[im 1/48]
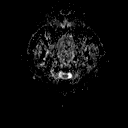
[im 12/48]
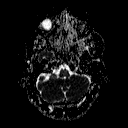
[im 24/48]
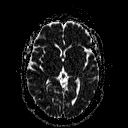
[im 36/48]
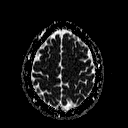
[im 48/48]
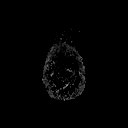

[Series 5: T2 · axial · 5.0mm · 0.36mm/px · z∈[-50,+86]mm · 2 of 22 slices shown]
[im 1/22]
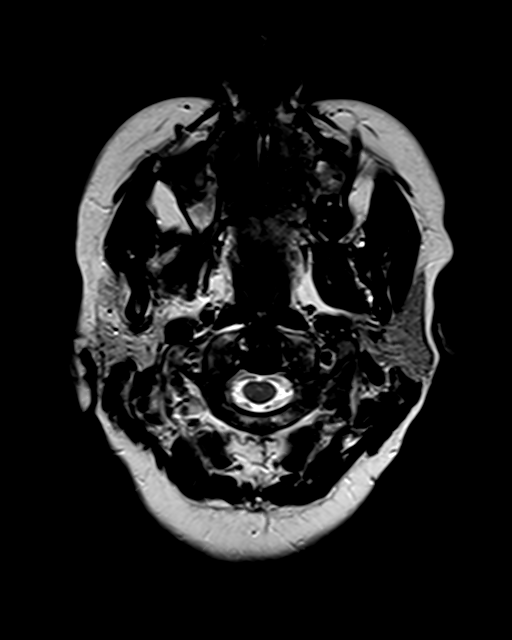
[im 22/22]
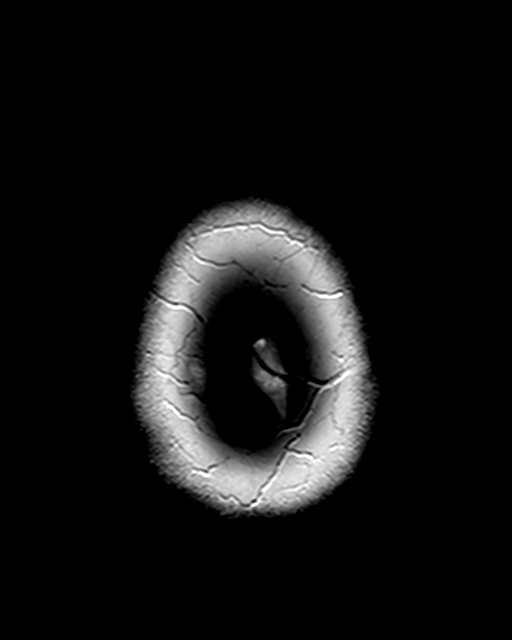

[Series 6: FLAIR · axial · 3.0mm · 0.45mm/px · z∈[-50,+85]mm · 3 of 30 slices shown]
[im 1/30]
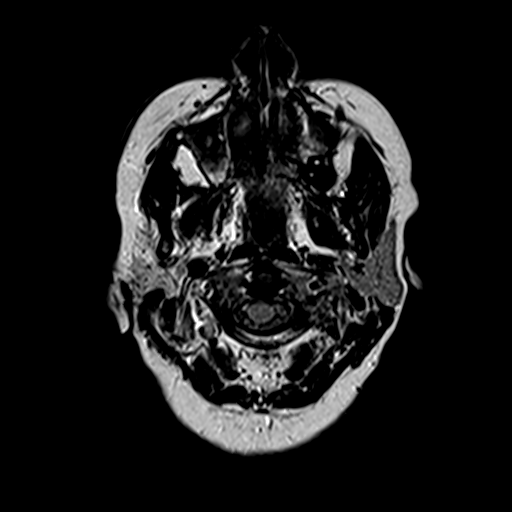
[im 15/30]
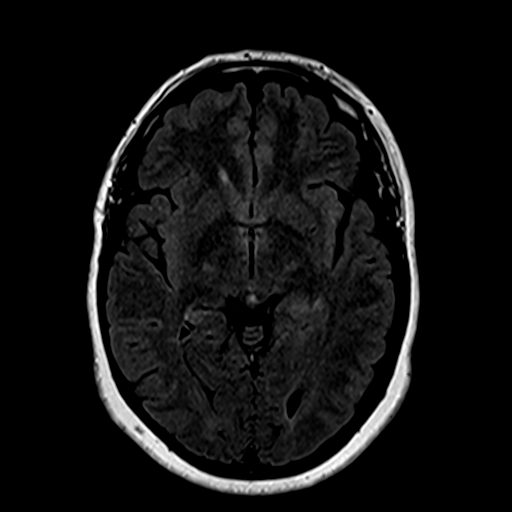
[im 30/30]
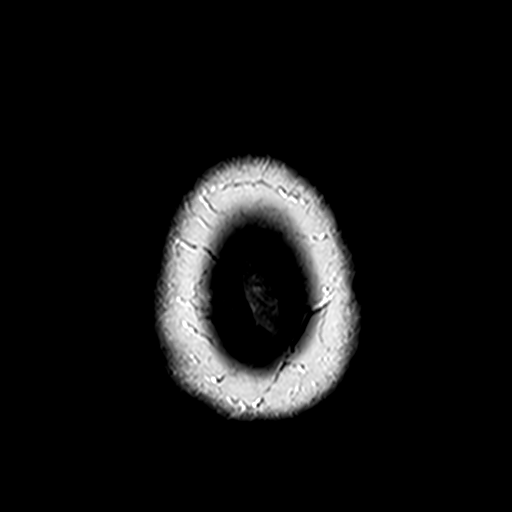

[Series 7: mip_images(sw) · axial · 32.0mm · 0.94mm/px · z∈[-38,+74]mm · 3 of 29 slices shown]
[im 1/29]
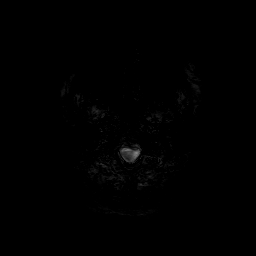
[im 15/29]
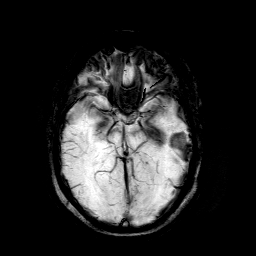
[im 29/29]
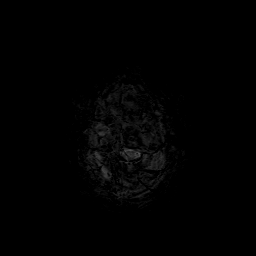

[Series 8: swi_images · axial · 4.0mm · 0.94mm/px · z∈[-52,+88]mm · 4 of 36 slices shown]
[im 1/36]
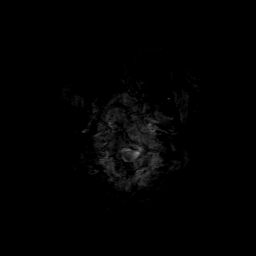
[im 12/36]
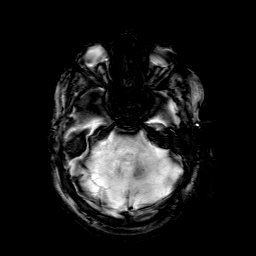
[im 24/36]
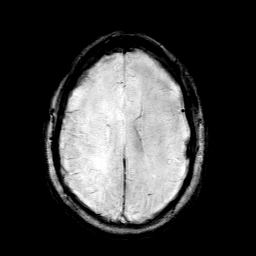
[im 36/36]
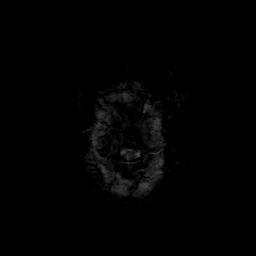

[Series 9: sag 3mm · sagittal · 3.0mm · 0.33mm/px · 1 of 11 slices shown]
[im 1/11]
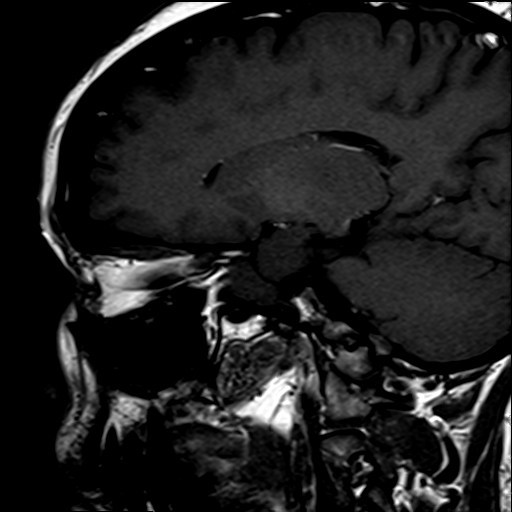

[Series 10: cor 3mm · coronal · 3.0mm · 0.33mm/px · 1 of 11 slices shown]
[im 1/11]
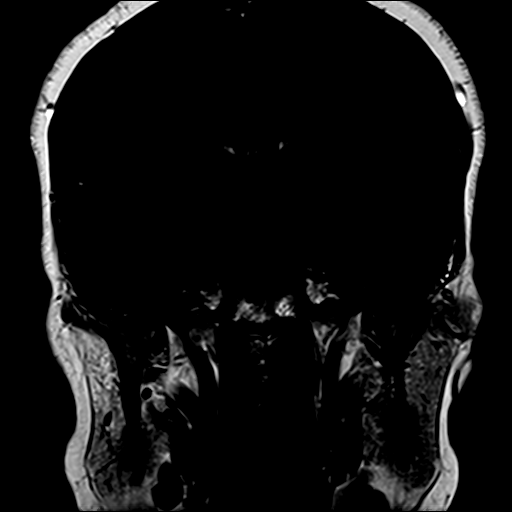

[Series 11: pre cor dynamic · coronal · non-contrast · 3.0mm · 0.35mm/px · 1 of 7 slices shown]
[im 1/7]
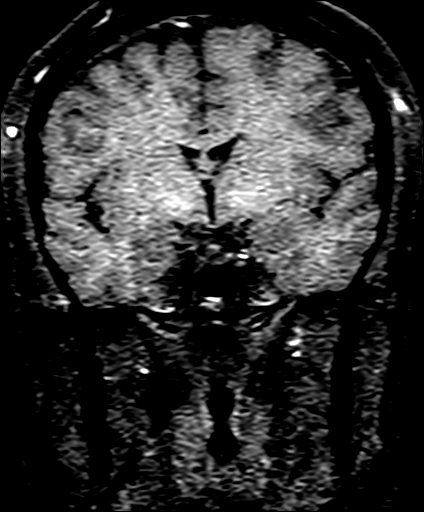

[Series 12: post fs cor · coronal · 3.0mm · 0.35mm/px · 1 of 7 slices shown (1 of 6)]
[im 1/7]
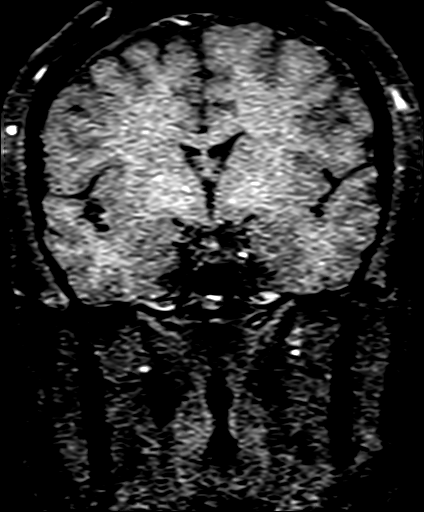

[Series 13: post fs cor · coronal · 3.0mm · 0.35mm/px · 1 of 7 slices shown (2 of 6)]
[im 1/7]
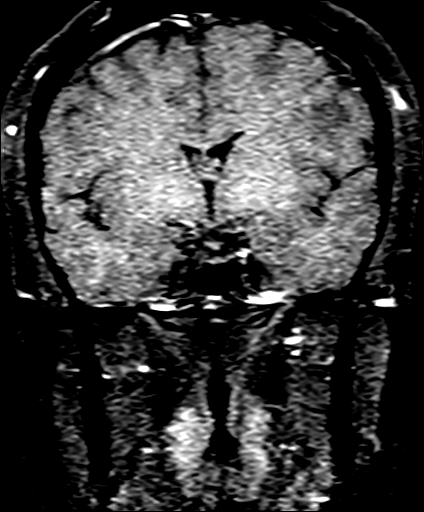

[Series 14: post fs cor · coronal · 3.0mm · 0.35mm/px · 1 of 7 slices shown (3 of 6)]
[im 1/7]
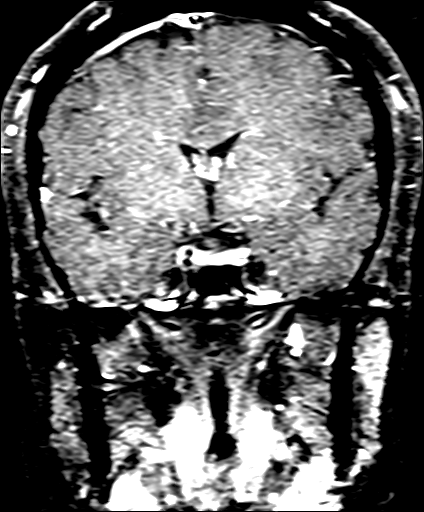

[Series 15: post fs cor · coronal · 3.0mm · 0.35mm/px · 1 of 7 slices shown (4 of 6)]
[im 1/7]
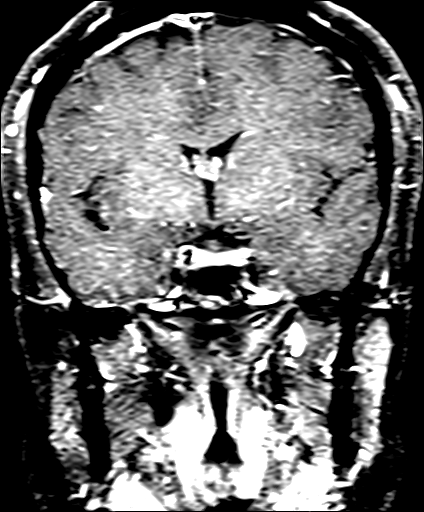

[Series 16: post fs cor · coronal · 3.0mm · 0.35mm/px · 1 of 7 slices shown (5 of 6)]
[im 1/7]
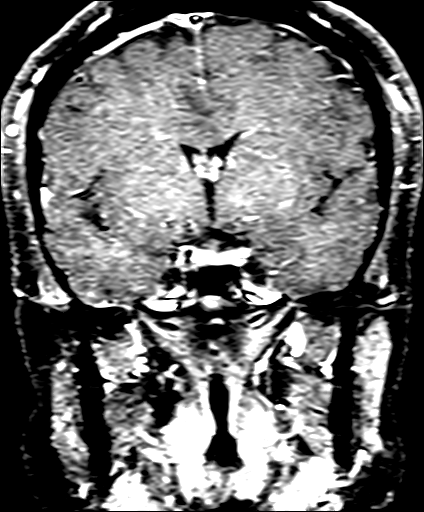

[Series 17: post fs cor · coronal · 3.0mm · 0.35mm/px · 1 of 7 slices shown (6 of 6)]
[im 1/7]
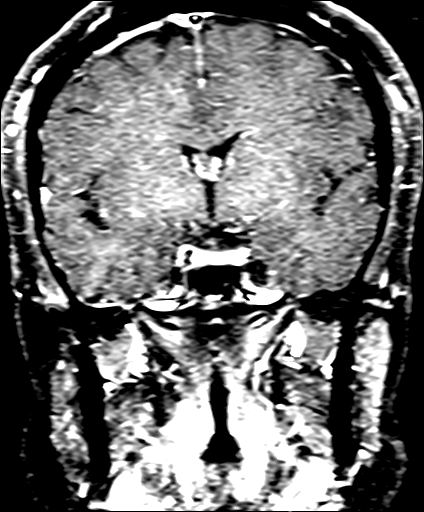

[Series 18: post sag 3mm · sagittal · 3.0mm · 0.33mm/px · 1 of 11 slices shown]
[im 1/11]
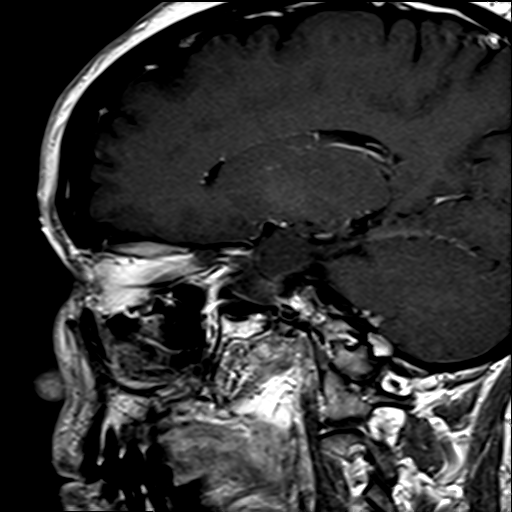

[37 of 48 positions shown; findings below may reference images not displayed]

FINDINGS: Brain: No acute infarction, hemorrhage, hydrocephalus, extra-axial
collection or mass lesion.

Rare is scattered foci of T2 hyperintensity are seen within the
white matter of the cerebral hemispheres, nonspecific.

Pituitary/Sella: The pituitary gland is asymmetric with the
parenchyma mostly flattened against the sellar floor with asymmetric
tissue along the medial wall of the left cavernous sinus with with
contrast enhancement similar to the rest of the pituitary gland. The
infundibulum is deviated to the left. The hypothalamus and mamillary
bodies are normal. There is no mass effect on the optic chiasm or
optic nerves. The infundibular and chiasmatic recesses are clear.
Normal cavernous sinus and cavernous internal carotid artery flow
voids.

Vascular: Normal flow voids.

Skull and upper cervical spine: Normal marrow signal.

Sinuses/Orbits: Negative.
IMPRESSION: 1. Asymmetric pituitary gland mostly flattened against the sellar
floor with asymmetric contrast enhancement along the medial wall of
the left cavernous sinus. No well defined hypoenhancing lesion
identified. Comparison with prior studies would be helpful, but none
were available at the time of this dictation.
2. Few nonspecific foci of T2 hyperintensity within the white matter
of the cerebral hemispheres which may be seen with migraine
headaches or chronic small vessel ischemic changes.

## 2021-04-21 ENCOUNTER — Ambulatory Visit (INDEPENDENT_AMBULATORY_CARE_PROVIDER_SITE_OTHER): Payer: 59 | Admitting: Medical

## 2021-04-21 ENCOUNTER — Other Ambulatory Visit: Payer: Self-pay

## 2021-04-21 ENCOUNTER — Ambulatory Visit (HOSPITAL_BASED_OUTPATIENT_CLINIC_OR_DEPARTMENT_OTHER)
Admission: RE | Admit: 2021-04-21 | Discharge: 2021-04-21 | Disposition: A | Discharge status: Home / Self Care | Payer: 59 | Source: Ambulatory Visit | Attending: Medical | Admitting: Medical

## 2021-04-21 VITALS — BP 120/64 | HR 61 | Temp 98.0°F | Resp 18 | Ht 62.0 in | Wt 164.3 lb

## 2021-04-21 DIAGNOSIS — Z0000 Encounter for general adult medical examination without abnormal findings: Secondary | ICD-10-CM | POA: Diagnosis not present

## 2021-04-21 DIAGNOSIS — M546 Pain in thoracic spine: Secondary | ICD-10-CM | POA: Diagnosis not present

## 2021-04-21 DIAGNOSIS — E559 Vitamin D deficiency, unspecified: Secondary | ICD-10-CM | POA: Diagnosis not present

## 2021-04-21 DIAGNOSIS — G8929 Other chronic pain: Secondary | ICD-10-CM | POA: Insufficient documentation

## 2021-04-21 DIAGNOSIS — R5383 Other fatigue: Secondary | ICD-10-CM

## 2021-04-21 DIAGNOSIS — Z1211 Encounter for screening for malignant neoplasm of colon: Secondary | ICD-10-CM | POA: Diagnosis not present

## 2021-04-21 DIAGNOSIS — M4134 Thoracogenic scoliosis, thoracic region: Secondary | ICD-10-CM

## 2021-04-21 NOTE — Patient Instructions (Addendum)
For you wellness exam today I have ordered cbc, cmp  lipid panel.  Pt will check paperwork at home to see when her last tdap done.. Pt states will defer flu vaccine. Pt had 2 covid vaccines. Pt declining boosters for covid.  Recommend exercise and healthy diet.  We will let you know lab results as they come in.  Follow up date appointment will be determined after lab review.    For chronic back pain will get tspine xray.  Referral to gi for screening colonoscopy.  Preventive Care 20-81 Years Old, Female Preventive care refers to lifestyle choices and visits with your health care provider that can promote health and wellness. This includes: A yearly physical exam. This is also called an annual wellness visit. Regular dental and eye exams. Immunizations. Screening for certain conditions. Healthy lifestyle choices, such as: Eating a healthy diet. Getting regular exercise. Not using drugs or products that contain nicotine and tobacco. Limiting alcohol use. What can I expect for my preventive care visit? Physical exam Your health care provider will check your: Height and weight. These may be used to calculate your BMI (body mass index). BMI is a measurement that tells if you are at a healthy weight. Heart rate and blood pressure. Body temperature. Skin for abnormal spots. Counseling Your health care provider may ask you questions about your: Past medical problems. Family's medical history. Alcohol, tobacco, and drug use. Emotional well-being. Home life and relationship well-being. Sexual activity. Diet, exercise, and sleep habits. Work and work Statistician. Access to firearms. Method of birth control. Menstrual cycle. Pregnancy history. What immunizations do I need? Vaccines are usually given at various ages, according to a schedule. Your health care provider will recommend vaccines for you based on your age, medical history, and lifestyle or other factors, such as travel or  where you work. What tests do I need? Blood tests Lipid and cholesterol levels. These may be checked every 5 years, or more often if you are over 73 years old. Hepatitis C test. Hepatitis B test. Screening Lung cancer screening. You may have this screening every year starting at age 75 if you have a 30-pack-year history of smoking and currently smoke or have quit within the past 15 years. Colorectal cancer screening. All adults should have this screening starting at age 51 and continuing until age 60. Your health care provider may recommend screening at age 56 if you are at increased risk. You will have tests every 1-10 years, depending on your results and the type of screening test. Diabetes screening. This is done by checking your blood sugar (glucose) after you have not eaten for a while (fasting). You may have this done every 1-3 years. Mammogram. This may be done every 1-2 years. Talk with your health care provider about when you should start having regular mammograms. This may depend on whether you have a family history of breast cancer. BRCA-related cancer screening. This may be done if you have a family history of breast, ovarian, tubal, or peritoneal cancers. Pelvic exam and Pap test. This may be done every 3 years starting at age 77. Starting at age 68, this may be done every 5 years if you have a Pap test in combination with an HPV test. Other tests STD (sexually transmitted disease) testing, if you are at risk. Bone density scan. This is done to screen for osteoporosis. You may have this scan if you are at high risk for osteoporosis. Talk with your health care provider about your  test results, treatment options, and if necessary, the need for more tests. Follow these instructions at home: Eating and drinking  Eat a diet that includes fresh fruits and vegetables, whole grains, lean protein, and low-fat dairy products. Take vitamin and mineral supplements as recommended by  your health care provider. Do not drink alcohol if: Your health care provider tells you not to drink. You are pregnant, may be pregnant, or are planning to become pregnant. If you drink alcohol: Limit how much you have to 0-1 drink a day. Be aware of how much alcohol is in your drink. In the [place] one drink equals one 12 oz bottle of beer (355 mL), one 5 oz glass of wine (148 mL), or one 1 oz glass of hard liquor (44 mL). Lifestyle Take daily care of your teeth and gums. Brush your teeth every morning and night with fluoride toothpaste. Floss one time each day. Stay active. Exercise for at least 30 minutes 5 or more days each week. Do not use any products that contain nicotine or tobacco, such as cigarettes, e-cigarettes, and chewing tobacco. If you need help quitting, ask your health care provider. Do not use drugs. If you are sexually active, practice safe sex. Use a condom or other form of protection to prevent STIs (sexually transmitted infections). If you do not wish to become pregnant, use a form of birth control. If you plan to become pregnant, see your health care provider for a prepregnancy visit. If told by your health care provider, take low-dose aspirin daily starting at age 105. Find healthy ways to cope with stress, such as: Meditation, yoga, or listening to music. Journaling. Talking to a trusted person. Spending time with friends and family. Safety Always wear your seat belt while driving or riding in a vehicle. Do not drive: If you have been drinking alcohol. Do not ride with someone who has been drinking. When you are tired or distracted. While texting. Wear a helmet and other protective equipment during sports activities. If you have firearms in your house, make sure you follow all gun safety procedures. What's next? Visit your health care provider once a year for an annual wellness visit. Ask your health care provider how often you should have your eyes and teeth  checked. Stay up to date on all vaccines. This information is not intended to replace advice given to you by your health care provider. Make sure you discuss any questions you have with your health care provider. Document Revised: 10/10/2020 Document Reviewed: 04/13/2018 Elsevier Patient Education  2022 [employer].

## 2021-04-21 NOTE — Progress Notes (Signed)
Subjective:    Patient ID: Jody Heath, female    DOB: 1970/10/29, 50 y.o.   MRN: [id number]  HPI  Pt in for evaluation/establish. Pt is fasting today.  Will get wellness exam.   Eats healthy, non smoker and no alcohol.  Last tetanus about 12 years ago.   Pt states she is feeling well today.  Pt has prolactinoma. Seeing Dr .Dwyane Dee.  Low vit D history. She is on vid rx weekly.  Pt give me update of surgery 2017. Rt ovarian adhesions, partial rt ovary removed. Removed rt ovarian adhesions and removed appendicty. Also adrenalectomy done.   Pt has diffuse bilateral thoracic area pain. Pain since 2017. Pain comes and goes. No med presently. Just rest when pain more severe. Pt state 8 level pain but usually does not take any meds.  Hysterectomy 6 years ago.  Review of Systems  Constitutional:  Negative for chills, fatigue and fever.  HENT:  Negative for congestion.   Respiratory:  Negative for cough, chest tightness and wheezing.   Cardiovascular:  Negative for chest pain and palpitations.  Gastrointestinal:  Negative for abdominal pain.  Genitourinary:  Negative for difficulty urinating, dysuria, frequency, genital sores and hematuria.  Musculoskeletal:  Negative for back pain.  Skin:  Negative for rash.  Neurological:  Negative for seizures, syncope, facial asymmetry, speech difficulty, weakness and numbness.  Hematological:  Negative for adenopathy. Does not bruise/bleed easily.  Psychiatric/Behavioral:  Negative for behavioral problems, confusion, decreased concentration and self-injury. The patient is not hyperactive.     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     Social History   Socioeconomic History   Marital status: Married    Spouse name: Not on file   Number of children: 2   Years of education: Not on file   Highest education level: Not on file  Occupational History   Not on file   Tobacco Use   Smoking status: Never   Smokeless tobacco: Never  Vaping Use   Vaping Use: Never used  Substance and Sexual Activity   Alcohol use: Yes    Comment: occ   Drug use: Never   Sexual activity: Not Currently  Other Topics Concern   Not on file  Social History Narrative   Not on file   Social Determinants of Health   Financial Resource Strain: Not on file  Food Insecurity: Not on file  Transportation Needs: Not on file  Physical Activity: Not on file  Stress: Not on file  Social Connections: Not on file  Intimate Partner Violence: Not on file    Past Surgical History:  Procedure Laterality Date   ABDOMINAL HYSTERECTOMY     ADRENALECTOMY     APPENDECTOMY N/A    Phreesia 01/19/2020   CESAREAN SECTION N/A    Phreesia 01/19/2020    Family History  Problem Relation Age of Onset   Diabetes Mother    Stroke Mother    Stroke Paternal Uncle    Kidney failure Father     Allergies  Allergen Reactions   Iodine Hives    Current Outpatient Medications on File Prior to Visit  Medication Sig Dispense Refill   cabergoline (DOSTINEX) 0.5 MG tablet TAKE 1/2 TABLETS (0.25 MG TOTAL) BY MOUTH 2 (TWO) TIMES A WEEK. 24 tablet 0   Vitamin D, Ergocalciferol, (DRISDOL) 1.25 MG (50000 UNIT) CAPS capsule Take 1 capsule (50,000 Units total) by mouth every 7 (seven)  days. 12 capsule 0   No current facility-administered medications on file prior to visit.    BP 120/64 (BP Location: Left Arm, Patient Position: Sitting, Cuff Size: Normal)   Pulse 61   Temp 98 F (36.7 C) (Oral)   Resp 18   Ht _0  (1.575 m)   Wt 164 lb 4.8 oz (74.5 kg)   SpO2 96%   BMI 30.05 kg/m       Objective:   Physical Exam   General Mental Status- Alert. General Appearance- Not in acute distress.   Skin General: Color- Normal Color. Moisture- Normal Moisture.  Neck Carotid Arteries- Normal color. Moisture- Normal Moisture. No carotid bruits. No JVD.  Chest and Lung  Exam Auscultation: Breath Sounds:-Normal.  Cardiovascular Auscultation:Rythm- Regular. Murmurs & Other Heart Sounds:Auscultation of the heart reveals- No Murmurs.  Abdomen Inspection:-Inspeection Normal. Palpation/Percussion:Note:No mass. Palpation and Percussion of the abdomen reveal- Non Tender, Non Distended + BS, no rebound or guarding.    Neurologic Cranial Nerve exam:- CN III-XII intact(No nystagmus), symmetric smile. Strength:- 5/5 equal and symmetric strength both upper and lower extremities.      Assessment & Plan:   Patient Instructions  For you wellness exam today I have ordered cbc, cmp  lipid panel.  Pt will check paperwork at home to see when her last tdap done.. Pt states will defer flu vaccine. Pt had 2 covid vaccines. Pt declining boosters for covid.  Recommend exercise and healthy diet.  We will let you know lab results as they come in.  Follow up date appointment will be determined after lab review.    For chronic back pain will get tspine xray.  Referral to gi for screening colonoscopy.  Preventive Care 37-70 Years Old, Female Preventive care refers to lifestyle choices and visits with your health care provider that can promote health and wellness. This includes: A yearly physical exam. This is also called an annual wellness visit. Regular dental and eye exams. Immunizations. Screening for certain conditions. Healthy lifestyle choices, such as: Eating a healthy diet. Getting regular exercise. Not using drugs or products that contain nicotine and tobacco. Limiting alcohol use. What can I expect for my preventive care visit? Physical exam Your health care provider will check your: Height and weight. These may be used to calculate your BMI (body mass index). BMI is a measurement that tells if you are at a healthy weight. Heart rate and blood pressure. Body temperature. Skin for abnormal spots. Counseling Your health care provider may ask you  questions about your: Past medical problems. Family's medical history. Alcohol, tobacco, and drug use. Emotional well-being. Home life and relationship well-being. Sexual activity. Diet, exercise, and sleep habits. Work and work Statistician. Access to firearms. Method of birth control. Menstrual cycle. Pregnancy history. What immunizations do I need? Vaccines are usually given at various ages, according to a schedule. Your health care provider will recommend vaccines for you based on your age, medical history, and lifestyle or other factors, such as travel or where you work. What tests do I need? Blood tests Lipid and cholesterol levels. These may be checked every 5 years, or more often if you are over 58 years old. Hepatitis C test. Hepatitis B test. Screening Lung cancer screening. You may have this screening every year starting at age 75 if you have a 30-pack-year history of smoking and currently smoke or have quit within the past 15 years. Colorectal cancer screening. All adults should have this screening starting at age 55 and  continuing until age 55. Your health care provider may recommend screening at age 58 if you are at increased risk. You will have tests every 1-10 years, depending on your results and the type of screening test. Diabetes screening. This is done by checking your blood sugar (glucose) after you have not eaten for a while (fasting). You may have this done every 1-3 years. Mammogram. This may be done every 1-2 years. Talk with your health care provider about when you should start having regular mammograms. This may depend on whether you have a family history of breast cancer. BRCA-related cancer screening. This may be done if you have a family history of breast, ovarian, tubal, or peritoneal cancers. Pelvic exam and Pap test. This may be done every 3 years starting at age 57. Starting at age 42, this may be done every 5 years if you have a Pap test in  combination with an HPV test. Other tests STD (sexually transmitted disease) testing, if you are at risk. Bone density scan. This is done to screen for osteoporosis. You may have this scan if you are at high risk for osteoporosis. Talk with your health care provider about your test results, treatment options, and if necessary, the need for more tests. Follow these instructions at home: Eating and drinking  Eat a diet that includes fresh fruits and vegetables, whole grains, lean protein, and low-fat dairy products. Take vitamin and mineral supplements as recommended by your health care provider. Do not drink alcohol if: Your health care provider tells you not to drink. You are pregnant, may be pregnant, or are planning to become pregnant. If you drink alcohol: Limit how much you have to 0-1 drink a day. Be aware of how much alcohol is in your drink. In the [place] one drink equals one 12 oz bottle of beer (355 mL), one 5 oz glass of wine (148 mL), or one 1 oz glass of hard liquor (44 mL). Lifestyle Take daily care of your teeth and gums. Brush your teeth every morning and night with fluoride toothpaste. Floss one time each day. Stay active. Exercise for at least 30 minutes 5 or more days each week. Do not use any products that contain nicotine or tobacco, such as cigarettes, e-cigarettes, and chewing tobacco. If you need help quitting, ask your health care provider. Do not use drugs. If you are sexually active, practice safe sex. Use a condom or other form of protection to prevent STIs (sexually transmitted infections). If you do not wish to become pregnant, use a form of birth control. If you plan to become pregnant, see your health care provider for a prepregnancy visit. If told by your health care provider, take low-dose aspirin daily starting at age 50. Find healthy ways to cope with stress, such as: Meditation, yoga, or listening to music. Journaling. Talking to a trusted  person. Spending time with friends and family. Safety Always wear your seat belt while driving or riding in a vehicle. Do not drive: If you have been drinking alcohol. Do not ride with someone who has been drinking. When you are tired or distracted. While texting. Wear a helmet and other protective equipment during sports activities. If you have firearms in your house, make sure you follow all gun safety procedures. What's next? Visit your health care provider once a year for an annual wellness visit. Ask your health care provider how often you should have your eyes and teeth checked. Stay up to date on all vaccines. This  information is not intended to replace advice given to you by your health care provider. Make sure you discuss any questions you have with your health care provider. Document Revised: 10/10/2020 Document Reviewed: 04/13/2018 Elsevier Patient Education  2022 [employer].

## 2021-04-24 LAB — VITAMIN D 1,25 DIHYDROXY
Vitamin D 1, 25 (OH)2 Total: 61 pg/mL (ref 18–72)
Vitamin D2 1, 25 (OH)2: 11 pg/mL
Vitamin D3 1, 25 (OH)2: 50 pg/mL

## 2021-05-06 ENCOUNTER — Telehealth: Payer: Self-pay

## 2021-05-06 NOTE — Addendum Note (Signed)
Addended by: Anabel Halon on: 05/06/2021 05:39 PM   Modules accepted: Orders

## 2021-05-06 NOTE — Telephone Encounter (Signed)
No current back pain , also wants to be referred to specialist for her scoliosis

## 2021-05-06 NOTE — Telephone Encounter (Signed)
Patient came into the office with questions about the xray from spine, had questions on what the meaning of   " Suture material is noted in the left upper quadrant"

## 2021-05-07 NOTE — Telephone Encounter (Signed)
Pt called and notified about referral , states no pain currently

## 2021-05-14 ENCOUNTER — Encounter: Payer: Self-pay | Admitting: Family Medicine

## 2021-05-14 ENCOUNTER — Ambulatory Visit (INDEPENDENT_AMBULATORY_CARE_PROVIDER_SITE_OTHER): Payer: 59 | Admitting: Family Medicine

## 2021-05-14 ENCOUNTER — Other Ambulatory Visit: Payer: Self-pay

## 2021-05-14 DIAGNOSIS — M545 Low back pain, unspecified: Secondary | ICD-10-CM | POA: Diagnosis not present

## 2021-05-14 NOTE — Progress Notes (Signed)
  Jody Heath - 50 y.o. female MRN [id number]  Date of birth: [birth year]  SUBJECTIVE:  Including CC & ROS.  No chief complaint on file.   Jody Heath is a 50 y.o. female that is presenting with acute on chronic low back pain.  Denies any significant pain today but does have pain intermittently on the left side.  Independent review of the thoracic spine x-ray from 9/6 shows scoliosis in the lumbar thoracic region.  Visit was conducted with an person interpreter  Review of Systems See HPI   HISTORY: Past Medical, Surgical, Social, and Family History Reviewed & Updated per EMR.   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    Past Surgical History:  Procedure Laterality Date   ABDOMINAL HYSTERECTOMY     ADRENALECTOMY     APPENDECTOMY N/A    [facility]reesia 01/19/2020   CESAREAN SECTION N/A    Phreesia 01/19/2020    Family History  Problem Relation Age of Onset   Diabetes Mother    Stroke Mother    Stroke Paternal Uncle    Kidney failure Father     Social History   Socioeconomic History   Marital status: Married    Spouse name: Not on file   Number of children: 2   Years of education: Not on file   Highest education level: Not on file  Occupational History   Not on file  Tobacco Use   Smoking status: Never   Smokeless tobacco: Never  Vaping Use   Vaping Use: Never used  Substance and Sexual Activity   Alcohol use: Yes    Comment: occ   Drug use: Never   Sexual activity: Not Currently  Other Topics Concern   Not on file  Social History Narrative   Not on file   Social Determinants of Health   Financial Resource Strain: Not on file  Food Insecurity: Not on file  Transportation Needs: Not on file  Physical Activity: Not on file  Stress: Not on file  Social Connections: Not on file  Intimate Partner Violence: Not on file     PHYSICAL EXAM:   VS: Ht 5\' 3"  (1.6 m)   Wt 165 lb (74.8 kg)   BMI 29.23 kg/m  Physical Exam Gen: NAD, alert, cooperative with exam, well-appearing      ASSESSMENT & PLAN:   Low back pain Has mild scoliosis changes on imaging. Pain is mild and intermittent.  - counseled on home exercise therapy and supportive care - could consider physical therapy.

## 2021-05-14 NOTE — Assessment & Plan Note (Signed)
Has mild scoliosis changes on imaging. Pain is mild and intermittent.  - counseled on home exercise therapy and supportive care - could consider physical therapy.

## 2021-05-14 NOTE — Patient Instructions (Signed)
Nice to meet you Please try heat as needed  Please try the exercises   Please send me a message in MyChart with any questions or updates.  Please see me back as needed.   --Dr. Raeford Razor

## 2021-06-01 ENCOUNTER — Other Ambulatory Visit: Payer: Self-pay

## 2021-06-01 ENCOUNTER — Ambulatory Visit (INDEPENDENT_AMBULATORY_CARE_PROVIDER_SITE_OTHER): Payer: 59 | Admitting: Medical

## 2021-06-01 VITALS — BP 105/70 | HR 76 | Temp 97.8°F | Resp 18 | Ht 63.0 in | Wt 167.6 lb

## 2021-06-01 DIAGNOSIS — R5383 Other fatigue: Secondary | ICD-10-CM

## 2021-06-01 DIAGNOSIS — R9389 Abnormal findings on diagnostic imaging of other specified body structures: Secondary | ICD-10-CM | POA: Diagnosis not present

## 2021-06-01 DIAGNOSIS — H9313 Tinnitus, bilateral: Secondary | ICD-10-CM | POA: Diagnosis not present

## 2021-06-01 DIAGNOSIS — Z0000 Encounter for general adult medical examination without abnormal findings: Secondary | ICD-10-CM

## 2021-06-01 LAB — T4, FREE: Free T4: 0.73 ng/dL (ref 0.60–1.60)

## 2021-06-01 LAB — COMPREHENSIVE METABOLIC PANEL
ALT: 40 U/L — ABNORMAL HIGH (ref 0–35)
AST: 29 U/L (ref 0–37)
Albumin: 4.5 g/dL (ref 3.5–5.2)
Alkaline Phosphatase: 69 U/L (ref 39–117)
BUN: 11 mg/dL (ref 6–23)
CO2: 31 mEq/L (ref 19–32)
Calcium: 10.3 mg/dL (ref 8.4–10.5)
Chloride: 102 mEq/L (ref 96–112)
Creatinine, Ser: 0.74 mg/dL (ref 0.40–1.20)
GFR: 95.29 mL/min (ref >60.00)
Glucose, Bld: 89 mg/dL (ref 70–99)
Potassium: 4.7 mEq/L (ref 3.5–5.1)
Sodium: 138 mEq/L (ref 135–145)
Total Bilirubin: 0.5 mg/dL (ref 0.2–1.2)
Total Protein: 7.6 g/dL (ref 6.0–8.3)

## 2021-06-01 LAB — CBC WITH DIFFERENTIAL/PLATELET
Basophils Absolute: 0 10*3/uL (ref 0.0–0.1)
Basophils Relative: 0.3 % (ref 0.0–3.0)
Eosinophils Absolute: 0.2 10*3/uL (ref 0.0–0.7)
Eosinophils Relative: 6.1 % — ABNORMAL HIGH (ref 0.0–5.0)
HCT: 41.1 % (ref 36.0–46.0)
Hemoglobin: 13.8 g/dL (ref 12.0–15.0)
Lymphocytes Relative: 39.7 % (ref 12.0–46.0)
Lymphs Abs: 1.3 10*3/uL (ref 0.7–4.0)
MCHC: 33.7 g/dL (ref 30.0–36.0)
MCV: 88.4 fl (ref 78.0–100.0)
Monocytes Absolute: 0.2 10*3/uL (ref 0.1–1.0)
Monocytes Relative: 7.4 % (ref 3.0–12.0)
Neutro Abs: 1.5 10*3/uL (ref 1.4–7.7)
Neutrophils Relative %: 46.5 % (ref 43.0–77.0)
Platelets: 147 10*3/uL — ABNORMAL LOW (ref 150.0–400.0)
RBC: 4.65 Mil/uL (ref 3.87–5.11)
RDW: 12.5 % (ref 11.5–15.5)
WBC: 3.3 10*3/uL — ABNORMAL LOW (ref 4.0–10.5)

## 2021-06-01 LAB — LIPID PANEL
Cholesterol: 176 mg/dL (ref 0–200)
HDL: 46.2 mg/dL (ref >39.00)
LDL Cholesterol: 94 mg/dL (ref 0–99)
NonHDL: 130.01
Total CHOL/HDL Ratio: 4
Triglycerides: 182 mg/dL — ABNORMAL HIGH (ref 0.0–149.0)
VLDL: 36.4 mg/dL (ref 0.0–40.0)

## 2021-06-01 LAB — VITAMIN B12: Vitamin B-12: 494 pg/mL (ref 211–911)

## 2021-06-01 LAB — TSH: TSH: 4.24 u[IU]/mL (ref 0.35–5.50)

## 2021-06-01 NOTE — Progress Notes (Addendum)
Subjective:    Patient ID: Jody Heath, female    DOB: September 11, 1970, 50 y.o.   MRN: [id number]  HPI  Pt in for some stuffiness in her ears for one month. Describes some probable tinnitus. She describes sound of blowing wind. No ear pain. No nasal congestion. No sinus pain.   Describes blowing sounds can be on both sides but worse on rt side. States will come and go. Last for 1-2 minutes and then subside. About 2-3 times a day.  No sneezing, no itching eyes, no runny nose and not headaches.  No htn hx. No thyroid disease. No abnormal hearing.  On review pt does have hx of prolactinoma.   May  2021 her mri brain showed.  IMPRESSION: 1. 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 No well defined hypoenhancing lesion identified. Comparison with prior studies would be helpful, but none were available at the time of this dictation. 2. Few nonspecific foci of T2 hyperintensity within the white matter of the cerebral hemispheres which may be seen with migraine headaches or chronic small vessel ischemic changes.  Pt following up with endocrinlogist in November.  Recent fatigue for months. Hx of anemia.  Abnormal xray copy pt shows me. Left upper chest fb. Does not match with pt reent xray lower abd suture materia. No sob reported.    Review of Systems  Constitutional:  Positive for fatigue. Negative for chills and fever.  HENT:  Negative for dental problem.   Respiratory:  Negative for cough, chest tightness and shortness of breath.   Cardiovascular:  Negative for chest pain and palpitations.  Gastrointestinal:  Negative for abdominal pain.  Genitourinary:  Negative for dysuria.  Musculoskeletal:  Negative for arthralgias and back pain.  Neurological:  Negative for dizziness, speech difficulty, weakness and light-headedness.  Hematological:  Negative for  adenopathy. Does not bruise/bleed easily.  Psychiatric/Behavioral:  Negative for behavioral problems and confusion.     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     Social History   Socioeconomic History   Marital status: Married    Spouse name: Not on file   Number of children: 2   Years of education: Not on file   Highest education level: Not on file  Occupational History   Not on file  Tobacco Use   Smoking status: Never   Smokeless tobacco: Never  Vaping Use   Vaping Use: Never used  Substance and Sexual Activity   Alcohol use: Yes    Comment: occ   Drug use: Never   Sexual activity: Not Currently  Other Topics Concern   Not on file  Social History Narrative   Not on file   Social Determinants of Health   Financial Resource Strain: Not on file  Food Insecurity: Not on file  Transportation Needs: Not on file  Physical Activity: Not on file  Stress: Not on file  Social Connections: Not on file  Intimate Partner Violence: Not on file    Past Surgical History:  Procedure Laterality Date   ABDOMINAL HYSTERECTOMY     ADRENALECTOMY     APPENDECTOMY N/A    Phreesia 01/19/2020   CESAREAN SECTION N/A    Phreesia 01/19/2020    Family History  Problem Relation Age of Onset   Diabetes Mother    Stroke Mother    Stroke Paternal  Uncle    Kidney failure Father     Allergies  Allergen Reactions   Iodine Hives    Current Outpatient Medications on File Prior to Visit  Medication Sig Dispense Refill   cabergoline (DOSTINEX) 0.5 MG tablet TAKE 1/2 TABLETS (0.25 MG TOTAL) BY MOUTH 2 (TWO) TIMES A WEEK. 24 tablet 0   Vitamin D, Ergocalciferol, (DRISDOL) 1.25 MG (50000 UNIT) CAPS capsule Take 1 capsule (50,000 Units total) by mouth every 7 (seven) days. 12 capsule 0   No current facility-administered medications on file prior to visit.    BP (!) 108/54   Pulse 76   Temp 97.8 F (36.6 C)    Resp 18   Ht 5\' 3"  (1.6 m)   Wt 167 lb 9.6 oz (76 kg)   SpO2 93%   BMI 29.69 kg/m       Objective:   Physical Exam  General Mental Status- Alert. General Appearance- Not in acute distress.   Skin General: Color- Normal Color. Moisture- Normal Moisture.  Neck Carotid Arteries- Normal color. Moisture- Normal Moisture. No carotid bruits. No JVD.  Chest and Lung Exam Auscultation: Breath Sounds:-Normal.  Cardiovascular Auscultation:Rythm- Regular. Murmurs & Other Heart Sounds:Auscultation of the heart reveals- No Murmurs.  Abdomen Inspection:-Inspeection Normal. Palpation/Percussion:Note:No mass. Palpation and Percussion of the abdomen reveal- Non Tender, Non Distended + BS, no rebound or guarding.    Neurologic Cranial Nerve exam:- CN III-XII intact(No nystagmus), symmetric smile. Strength:- 5/5 equal and symmetric strength both upper and lower extremities.   Heent- no sinus pressure, canals clear with not wax on both sides. TM also normal bilaterally.        Assessment & Plan:   You have transient intermittent mild tinnitus for one month. On exam no obvious cause as well as no underlying condition/cause associated  with tinnitus presently.  I do think best to refer to ENT to get opinion.   Please review the education material/sheet below. If symptoms change or worsen during the interim pending referral please let me know.  For hx of prolactima follow up with endocrinologist in November.   Follow up with me in 2 months or sooner if needed.   Time spent with patient today was 41 minutes which consisted of chart review, discussing diagnosis, work up,treatment, discussing abnormal xray and documentation.

## 2021-06-01 NOTE — Addendum Note (Signed)
Addended by: Anabel Halon on: 06/01/2021 11:07 AM   Modules accepted: Orders

## 2021-06-01 NOTE — Addendum Note (Signed)
Addended by: Kelle Darting A on: 06/01/2021 11:43 AM   Modules accepted: Orders

## 2021-06-01 NOTE — Addendum Note (Signed)
Addended by: Anabel Halon on: 06/01/2021 11:03 AM   Modules accepted: Orders

## 2021-06-01 NOTE — Addendum Note (Signed)
Addended by: Anabel Halon on: 06/01/2021 11:10 AM   Modules accepted: Level of Service

## 2021-06-01 NOTE — Addendum Note (Signed)
Addended by: Anabel Halon on: 06/01/2021 11:19 AM   Modules accepted: Level of Service

## 2021-06-01 NOTE — Patient Instructions (Addendum)
You have transient intermittent mild tinnitus for one month. On exam no obvious cause as well as no underlying condition/cause associated  with tinnitus presently.  I do think best to refer to ENT to get opinion.   Please review the education material/sheet below. If symptoms change or worsen during the interim pending referral please let me know.  For hx of prolactinoma follow up with endocrinologist in November.  For fatigue placed b12 order and thyroid studies. Hopefully those labs will be done today.  Follow up with me in 2 months or sooner if needed.  Will need to talk with radiologist about pt abnormal chest xray. Looks like pen overlying left side chest. I don't see that mentioned on pt xray report.  Tinnitus Tinnitus refers to hearing a sound when there is no actual source for that sound. This is often described as ringing in the ears. However, people with this condition may hear a variety of noises, in one ear or in both ears. The sounds of tinnitus can be soft, loud, or somewhere in between. Tinnitus can last for a few seconds or can be constant for days. It may go away without treatment and come back at various times. When tinnitus is constant or happens often, it can lead to other problems, such as trouble sleeping and trouble concentrating. Almost everyone experiences tinnitus at some point. Tinnitus is not the same as hearing loss. Tinnitus that is long-lasting (chronic) or comes back often (recurs) may require medical attention. What are the causes? The cause of tinnitus is often not known. In some cases, it can result from: Exposure to loud noises from machinery, music, or other sources. An object (foreign body) stuck in the ear. Earwax buildup. Drinking alcohol or caffeine. Taking certain medicines. Age-related hearing loss. It may also be caused by medical conditions such as: Ear or sinus infections. Heart diseases or high blood pressure. Allergies. Mnire's  disease. Thyroid problems. Tumors. A weak, bulging blood vessel (aneurysm) near the ear. What increases the risk? The following factors may make you more likely to develop this condition: Exposure to loud noises. Age. Tinnitus is more likely in older individuals. Using alcohol or tobacco. What are the signs or symptoms? The main symptom of tinnitus is hearing a sound when there is no source for that sound. It may sound like: Buzzing. Sizzling. Ringing. Blowing air. Hissing. Whistling. Other sounds may include: Roaring. Running water. A musical note. Tapping. Humming. Symptoms may affect only one ear (unilateral) or both ears (bilateral). How is this diagnosed? Tinnitus is diagnosed based on your symptoms, your medical history, and a physical exam. Your health care provider may do a thorough hearing test (audiologic exam) if your tinnitus: Is unilateral. Causes hearing difficulties. Lasts 6 months or longer. You may work with a health care provider who specializes in hearing disorders (audiologist). You may be asked questions about your symptoms and how they affect your daily life. You may have other tests done, such as: CT scan. MRI. An imaging test of how blood flows through your blood vessels (angiogram). How is this treated? Treating an underlying medical condition can sometimes make tinnitus go away. If your tinnitus continues, other treatments may include: Therapy and counseling to help you manage the stress of living with tinnitus. Sound generators to mask the tinnitus. These include: Tabletop sound machines that play relaxing sounds to help you fall asleep. Wearable devices that fit in your ear and play sounds or music. Acoustic neural stimulation. This involves using headphones  to listen to music that contains an auditory signal. Over time, listening to this signal may change some pathways in your brain and make you less sensitive to tinnitus. This treatment is used for  very severe cases when no other treatment is working. Using hearing aids or cochlear implants if your tinnitus is related to hearing loss. Hearing aids are worn in the outer ear. Cochlear implants are surgically placed in the inner ear. Follow these instructions at home: Managing symptoms   When possible, avoid being in loud places and being exposed to loud sounds. Wear hearing protection, such as earplugs, when you are exposed to loud noises. Use a white noise machine, a humidifier, or other devices to mask the sound of tinnitus. Practice techniques for reducing stress, such as meditation, yoga, or deep breathing. Work with your health care provider if you need help with managing stress. Sleep with your head slightly raised. This may reduce the impact of tinnitus. General instructions Do not use stimulants, such as nicotine, alcohol, or caffeine. Talk with your health care provider about other stimulants to avoid. Stimulants are substances that can make you feel alert and attentive by increasing certain activities in the body (such as heart rate and blood pressure). These substances may make tinnitus worse. Take over-the-counter and prescription medicines only as told by your health care provider. Try to get plenty of sleep each night. Keep all follow-up visits. This is important. Contact a health care provider if: Your tinnitus continues for 3 weeks or longer without stopping. You develop sudden hearing loss. Your symptoms get worse or do not get better with home care. You feel you are not able to manage the stress of living with tinnitus. Get help right away if: You develop tinnitus after a head injury. You have tinnitus along with any of the following: Dizziness. Nausea and vomiting. Loss of balance. Sudden, severe headache. Vision changes. Facial weakness or weakness of arms or legs. These symptoms may represent a serious problem that is an emergency. Do not wait to see if the  symptoms will go away. Get medical help right away. Call your local emergency services (911 in the [place] Do not drive yourself to the hospital. Summary Tinnitus refers to hearing a sound when there is no actual source for that sound. This is often described as ringing in the ears. Symptoms may affect only one ear (unilateral) or both ears (bilateral). Use a white noise machine, a humidifier, or other devices to mask the sound of tinnitus. Do not use stimulants, such as nicotine, alcohol, or caffeine. These substances may make tinnitus worse. This information is not intended to replace advice given to you by your health care provider. Make sure you discuss any questions you have with your health care provider. Document Revised: 07/07/2020 Document Reviewed: 07/07/2020 Elsevier Patient Education  2022 [employer].

## 2021-06-18 DIAGNOSIS — H9313 Tinnitus, bilateral: Secondary | ICD-10-CM

## 2021-06-18 HISTORY — DX: Tinnitus, bilateral: H93.13

## 2021-07-10 ENCOUNTER — Other Ambulatory Visit: Payer: 59

## 2021-07-13 ENCOUNTER — Other Ambulatory Visit (INDEPENDENT_AMBULATORY_CARE_PROVIDER_SITE_OTHER): Payer: 59

## 2021-07-13 ENCOUNTER — Ambulatory Visit: Payer: 59 | Admitting: Endocrinology

## 2021-07-13 ENCOUNTER — Other Ambulatory Visit: Payer: Self-pay

## 2021-07-13 DIAGNOSIS — E221 Hyperprolactinemia: Secondary | ICD-10-CM | POA: Diagnosis not present

## 2021-07-13 NOTE — Progress Notes (Deleted)
Patient ID: Jody Heath, female   DOB: [birth year], 50 y.o.   MRN: [id number]              Chief complaint: Follow-up of high prolactin  History of Present Illness  Prior history: She apparently had problems with milk discharge from her breasts and 2018 and was found to have high prolactin level This was done by a physician in [place] and no records are available She does not know what her prolactin level was or what her MRI scan showed She thinks that with taking cabergoline half tablet twice a week her prolactin level had come down and after about 1-1/2 years the medication was stopped but she did not have a follow-up subsequently  Recent history:  In 3/21 her prolactin level was 109 on her initial visit She had not complained of any spontaneous milky secretions from her breasts at that time  She has had a hysterectomy and cannot assess menstrual cycles  Because of significant hyperprolactinemia she was started on cabergoline in 11/2019 She has been continued on cabergoline 0.25 mg twice a week  Since her prolactin was down to 3.4 she has been only on a weekly dose of cabergoline 0.25 mg since 10/2020  She says she has not noticed any spontaneous milky secretions but she usually does not try to check for this  Now her prolactin level is 15.8, previously at 3.4,  Prolactin levels:  Lab Results  Component Value Date   PROLACTIN 15.8 01/06/2021   PROLACTIN 3.4 (L) 10/23/2020   PROLACTIN 11.5 05/07/2020   PROLACTIN 13.3 12/31/2019    Since previous MRI was not available repeat imaging was done MRI of pituitary gland showed the following results  MRI of pituitary gland, 12/26/2019:  Asymmetric pituitary gland mostly flattened against the sellar floor with asymmetric contrast enhancement along the medial wall of the left cavernous sinus.  No well defined hypoenhancing lesion identified.   Report from 2018 not available  She has no other evidence of  hypopituitarism  Lab Results  Component Value Date   TSH 4.24 06/01/2021   TSH 3.930 11/02/2019   FREET4 0.73 06/01/2021   FREET4 0.74 12/31/2019     Allergies as of 07/13/2021       Reactions   Iodine Hives        Medication List        Accurate as of July 13, 2021  9:40 AM. If you have any questions, ask your nurse or doctor.          cabergoline 0.5 MG tablet Commonly known as: DOSTINEX TAKE 1/2 TABLETS (0.25 MG TOTAL) BY MOUTH 2 (TWO) TIMES A WEEK.   Vitamin D (Ergocalciferol) 1.25 MG (50000 UNIT) Caps capsule Commonly known as: DRISDOL Take 1 capsule (50,000 Units total) by mouth every 7 (seven) days.        Allergies:  Allergies  Allergen Reactions   Iodine Hives    Past Medical History:  Diagnosis Date   Anemia    Phreesia 01/19/2020   Hyperprolactinemia ([place]) 11/09/2019    Past Surgical History:  Procedure Laterality Date   ABDOMINAL HYSTERECTOMY     ADRENALECTOMY     APPENDECTOMY N/A    Phreesia 01/19/2020   CESAREAN SECTION N/A    Phreesia 01/19/2020    Family History  Problem Relation Age of Onset   Diabetes Mother    Stroke Mother    Stroke Paternal Uncle    Kidney failure Father     Social History:  reports that she has never smoked. She has never used smokeless tobacco. She reports current alcohol use. She reports that she does not use drugs.   Review of Systems  Neurological:  Negative for tremors.  She has vitamin D deficiency on routine testing and is being treated by PCP now taking OTC  No symptoms currently of hot flashes or sweating spells  EXAM:  There were no vitals taken for this visit.  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 Any transcriptional errors that result from this process are unintentional.

## 2021-07-14 LAB — PROLACTIN: Prolactin: 12.8 ng/mL (ref 4.8–23.3)

## 2021-08-09 ENCOUNTER — Other Ambulatory Visit: Payer: Self-pay

## 2021-08-09 ENCOUNTER — Emergency Department (HOSPITAL_BASED_OUTPATIENT_CLINIC_OR_DEPARTMENT_OTHER)
Admission: EM | Admit: 2021-08-09 | Discharge: 2021-08-09 | Disposition: A | Discharge status: Home / Self Care | Payer: 59 | Attending: Emergency Medicine | Admitting: Emergency Medicine

## 2021-08-09 ENCOUNTER — Encounter (HOSPITAL_BASED_OUTPATIENT_CLINIC_OR_DEPARTMENT_OTHER): Payer: Self-pay

## 2021-08-09 DIAGNOSIS — M545 Low back pain, unspecified: Secondary | ICD-10-CM | POA: Insufficient documentation

## 2021-08-09 MED ORDER — HYDROCODONE-ACETAMINOPHEN 5-325 MG PO TABS
1.0000 | ORAL_TABLET | Freq: Once | ORAL | Status: AC
  Administered 2021-08-09: 13:00:00 1 via ORAL
  Filled 2021-08-09: qty 1

## 2021-08-09 MED ORDER — NAPROXEN 500 MG PO TABS: 500.0000 mg | ORAL_TABLET | Freq: Two times a day (BID) | ORAL | 0 refills | Status: AC

## 2021-08-09 MED ORDER — LIDOCAINE 5 % EX PTCH: 1.0000 | MEDICATED_PATCH | CUTANEOUS | 0 refills | Status: AC

## 2021-08-09 MED ORDER — METHOCARBAMOL 500 MG PO TABS: 500.0000 mg | ORAL_TABLET | Freq: Two times a day (BID) | ORAL | 0 refills | Status: AC

## 2021-08-09 MED ORDER — HYDROCODONE-ACETAMINOPHEN 5-325 MG PO TABS: 2.0000 | ORAL_TABLET | Freq: Four times a day (QID) | ORAL | 0 refills | Status: AC | PRN

## 2021-08-09 MED ORDER — KETOROLAC TROMETHAMINE 15 MG/ML IJ SOLN
15.0000 mg | Freq: Once | INTRAMUSCULAR | Status: AC
  Administered 2021-08-09: 13:00:00 15 mg via INTRAMUSCULAR
  Filled 2021-08-09: qty 1

## 2021-08-09 NOTE — ED Triage Notes (Signed)
Pt c/o lower back pain started yesterday-denies injury-slow gait-NAD

## 2021-08-09 NOTE — Discharge Instructions (Addendum)
Your exam today was reassuring against a serious cause of your back pain.  You received pain medication in the emergency room.  In addition I have sent in narcotic pain medication to your pharmacy, along with naproxen and muscle relaxer.  The pain medicine and muscle relaxer can make you drowsy.  Do not take before driving.  If you develop weakness, fever, worsening pain, difficulty with bladder or bowel control, numbness in your groin please return to the emergency room.  Otherwise follow-up with Dr. Raeford Razor.

## 2021-08-09 NOTE — ED Provider Notes (Signed)
[facility] DEPART[facility] Provider Note   CSN: [id number] Arrival date & time: 08/09/21  1107     History Chief Complaint  Patient presents with   Back Pain    Jody Heath is a 50 y.o. female.  50 year old female presents today for evaluation of back pain onset yesterday.  Patient has history of scoliosis and chronic low back pain.  Patient has seen Dr. Raeford Razor for same issue.  Patient denies recent injury or heavy lifting.  She denies fever, chills, abdominal pain, nausea, vomiting, difficulty with bladder or bowel control, paresthesias in lower extremities or saddle anesthesia.  She denies any history of IV drug use, unintentional weight loss.  She reports certain positions such as not laying on her back help otherwise she has not taken anything over-the-counter prior to arrival.  She presents with her husband today.  She describes her pain as a tightness and spasm in her lower back.  She reports her walking has slowed since her pain came on yesterday.  The history is provided by the patient. No language interpreter was used.      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     Family History  Problem Relation Age of Onset   Diabetes Mother    Stroke Mother    Stroke Paternal Uncle    Kidney failure Father      Social History   Tobacco Use   Smoking status: Never   Smokeless tobacco: Never  Vaping Use   Vaping Use: Never used  Substance Use Topics   Alcohol use: Yes    Comment: occ   Drug use: Never    Home Medications Prior to Admission medications   Medication Sig Start Date End Date Taking? Authorizing Provider  cabergoline (DOSTINEX) 0.5 MG tablet TAKE 1/2 TABLETS (0.25 MG TOTAL) BY MOUTH 2 (TWO) TIMES A WEEK. 09/01/20   Elayne Snare, MD  Vitamin D, Ergocalciferol, (DRISDOL) 1.25 MG (50000 UNIT) CAPS capsule Take 1 capsule (50,000 Units total) by mouth every 7 (seven) days. 01/03/20   Elayne Snare, MD    Allergies    Iodine  Review of Systems   Review of Systems  Constitutional:  Negative for activity change, chills and fever.  Gastrointestinal:  Negative for abdominal pain, nausea and vomiting.  Genitourinary:  Negative for difficulty urinating, dysuria and flank pain.  Musculoskeletal:  Positive for back pain.  Neurological:  Negative for weakness, light-headedness and numbness.  All other systems reviewed and are negative.  Physical Exam Updated Vital Signs BP (!) 115/52 (BP Location: Left Arm)    Pulse 74    Temp 98.1 F (36.7 C) (Oral)    Resp 18    Ht 5\' 2"  (1.575 m)    Wt 75.8 kg    SpO2 100%  BMI 30.54 kg/m   Physical Exam Vitals and nursing note reviewed.  Constitutional:      General: She is not in acute distress.    Appearance: Normal appearance. She is not ill-appearing.  HENT:     Head: Normocephalic and atraumatic.     Nose: Nose normal.  Eyes:     General: No scleral icterus.    Extraocular Movements: Extraocular movements intact.     Conjunctiva/sclera: Conjunctivae normal.  Cardiovascular:     Rate and Rhythm: Normal rate and regular rhythm.     Pulses: Normal pulses.     Heart sounds: Normal heart sounds.  Pulmonary:     Effort: Pulmonary effort is normal. No respiratory distress.     Breath sounds: Normal breath sounds. No wheezing or rales.   Abdominal:     General: There is no distension.     Tenderness: There is no abdominal tenderness.  Musculoskeletal:        General: Normal range of motion.     Cervical back: Normal range of motion.     Comments: Spine without visible deformity.  Cervical, thoracic, lumbar spine without tenderness to palpation.  Lumbar paraspinal muscles with tenderness to palpation present.  Bilateral straight leg raise test negative.  Patient with 5/5 strength in bilateral lower extremities including hips, knees, and ankles.  Sensation intact.  DP pulses 2+ and symmetrical.  Patient able to ambulate however slowly because of tightness.  Skin:    General: Skin is warm and dry.  Neurological:     General: No focal deficit present.     Mental Status: She is alert. Mental status is at baseline.    ED Results / Procedures / Treatments   Labs (all labs ordered are listed, but only abnormal results are displayed) Labs Reviewed - No data to display  EKG None  Radiology No results found.  Procedures Procedures   Medications Ordered in ED Medications  ketorolac (TORADOL) 15 MG/ML injection 15 mg (has no administration in time range)  HYDROcodone-acetaminophen (NORCO/VICODIN) 5-325 MG per tablet 1 tablet (has no administration in time range)    ED Course  I have reviewed the triage vital signs and the nursing notes.  Pertinent labs & imaging results that were available during my care of the patient were reviewed by me and considered in my medical decision making (see chart for details).    MDM Rules/Calculators/A&P                         50 year old female presents today for evaluation of low back pain since yesterday.  Patient does have history of chronic low back pain.  Exam and history without concern for red flag signs or symptoms concerning for cauda equina or spinal epidural abscess.  Symptomatic treatment discussed.  Muscle relaxer, naproxen, Norco, lidocaine patch prescribed.  Patient is  established with Dr. Raeford Razor and will follow-up with him in the next couple days.  Return precautions discussed with patient and husband at length.  They voiced understanding and are in agreement with plan.    Final Clinical Impression(s) / ED Diagnoses Final diagnoses:  Acute low back pain without sciatica, unspecified back pain laterality    Rx / DC Orders ED Discharge Orders          Ordered    HYDROcodone-acetaminophen (NORCO/VICODIN) 5-325 MG tablet  Every 6 hours PRN        08/09/21 1346    methocarbamol (ROBAXIN) 500 MG  tablet  2 times daily        08/09/21 1346    naproxen (NAPROSYN) 500 MG tablet  2 times daily        08/09/21 1346    lidocaine (LIDODERM) 5 %  Every 24 hours        08/09/21 1346             Evlyn Courier, PA-C 08/09/21 1348    Lennice Sites, DO 08/09/21 1355

## 2021-08-12 ENCOUNTER — Ambulatory Visit (INDEPENDENT_AMBULATORY_CARE_PROVIDER_SITE_OTHER): Payer: 59 | Admitting: Family Medicine

## 2021-08-12 ENCOUNTER — Encounter: Payer: Self-pay | Admitting: Family Medicine

## 2021-08-12 ENCOUNTER — Other Ambulatory Visit: Payer: Self-pay

## 2021-08-12 ENCOUNTER — Ambulatory Visit (HOSPITAL_BASED_OUTPATIENT_CLINIC_OR_DEPARTMENT_OTHER)
Admission: RE | Admit: 2021-08-12 | Discharge: 2021-08-12 | Disposition: A | Discharge status: Home / Self Care | Payer: 59 | Source: Ambulatory Visit | Attending: Family Medicine | Admitting: Family Medicine

## 2021-08-12 VITALS — BP 120/52 | Ht 62.0 in | Wt 167.0 lb

## 2021-08-12 DIAGNOSIS — S39012A Strain of muscle, fascia and tendon of lower back, initial encounter: Secondary | ICD-10-CM | POA: Insufficient documentation

## 2021-08-12 HISTORY — DX: Strain of muscle, fascia and tendon of lower back, initial encounter: S39.012A

## 2021-08-12 NOTE — Progress Notes (Signed)
°  Jody Heath - 50 y.o. female MRN [id number]  Date of birth: [birth year]  SUBJECTIVE:  Including CC & ROS.  No chief complaint on file.   Jody Heath is a 50 y.o. female that is presenting with acute low back pain.  Has been ongoing since Christmas day.  No recent injury or inciting event.  No radicular component.   Review of Systems See HPI   HISTORY: Past Medical, Surgical, Social, and Family History Reviewed & Updated per EMR.   Pertinent Historical Findings include:  Past Medical History:  Diagnosis Date   Anemia    Phreesia 01/19/2020   Hyperprolactinemia ([place]) 11/09/2019    Past Surgical History:  Procedure Laterality Date   ABDOMINAL HYSTERECTOMY     ADRENALECTOMY     APPENDECTOMY N/A    Phreesia 01/19/2020   CESAREAN SECTION N/A    Phreesia 01/19/2020    Family History  Problem Relation Age of Onset   Diabetes Mother    Stroke Mother    Stroke Paternal Uncle    Kidney failure Father     Social History   Socioeconomic History   Marital status: Married    Spouse name: Not on file   Number of children: 2   Years of education: Not on file   Highest education level: Not on file  Occupational History   Not on file  Tobacco Use   Smoking status: Never   Smokeless tobacco: Never  Vaping Use   Vaping Use: Never used  Substance and Sexual Activity   Alcohol use: Yes    Comment: occ   Drug use: Never   Sexual activity: Not Currently  Other Topics Concern   Not on file  Social History Narrative   Not on file   Social Determinants of Health   Financial Resource Strain: Not on file  Food Insecurity: Not on file  Transportation Needs: Not on file  Physical Activity: Not on file  Stress: Not on file  Social Connections: Not on file  Intimate Partner Violence: Not on file     PHYSICAL EXAM:  VS: BP (!) 120/52 (BP Location: Left Arm, Patient Position: Sitting)    Ht 5\' 2"  (1.575 m)    Wt 167 lb (75.8 kg)    BMI 30.54 kg/m  Physical  Exam Gen: NAD, alert, cooperative with exam, well-appearing    ASSESSMENT & PLAN:   Strain of lumbar region Acutely occurring.  No radicular component. -Counseled on home exercise therapy and supportive care. -X-ray. -Could consider physical therapy.

## 2021-08-12 NOTE — Assessment & Plan Note (Signed)
Acutely occurring.  No radicular component. -Counseled on home exercise therapy and supportive care. -X-ray. -Could consider physical therapy.

## 2021-08-12 NOTE — Patient Instructions (Signed)
Nice to meet you Please try heat  Please try the exercise  I will call with the results.   Please send me a message in MyChart with any questions or updates.  Please see me back in 1-2 weeks.   --Dr. Raeford Razor

## 2021-08-18 ENCOUNTER — Telehealth: Payer: Self-pay | Admitting: Family Medicine

## 2021-08-18 NOTE — Telephone Encounter (Signed)
Unable to call for patient. If she calls back please have her speak with a nurse/CMA and inform that his xray is showing degenerative changes and transitional anatomy which can contribute to her pain.   If any questions then please take the best time and phone number to call and I will try to call her back.   Rosemarie Ax, MD [facility] 08/18/2021, 4:21 PM

## 2021-08-27 ENCOUNTER — Encounter: Payer: Self-pay | Admitting: Family Medicine

## 2021-08-27 ENCOUNTER — Ambulatory Visit (INDEPENDENT_AMBULATORY_CARE_PROVIDER_SITE_OTHER): Payer: 59 | Admitting: Family Medicine

## 2021-08-27 VITALS — BP 108/68 | Ht 62.0 in | Wt 167.0 lb

## 2021-08-27 DIAGNOSIS — M533 Sacrococcygeal disorders, not elsewhere classified: Secondary | ICD-10-CM

## 2021-08-27 DIAGNOSIS — S39012D Strain of muscle, fascia and tendon of lower back, subsequent encounter: Secondary | ICD-10-CM | POA: Diagnosis not present

## 2021-08-27 HISTORY — DX: Sacrococcygeal disorders, not elsewhere classified: M53.3

## 2021-08-27 NOTE — Assessment & Plan Note (Signed)
Has had improvement of her pain. -Counseled on home exercise therapy and supportive care. -Could consider physical therapy.Marland Kitchen

## 2021-08-27 NOTE — Progress Notes (Signed)
°  Jody Heath - 51 y.o. female MRN [id number]  Date of birth: October 07, 1970  SUBJECTIVE:  Including CC & ROS.  No chief complaint on file.   Jody Heath is a 51 y.o. female that is following up for her low back pain.  She feels improvement of the back but still has intermittent SI joint pain.  Denies any radicular pain.    Review of Systems See HPI   HISTORY: Past Medical, Surgical, Social, and Family History Reviewed & Updated per EMR.   Pertinent Historical Findings include:  Past Medical History:  Diagnosis Date   Anemia    Phreesia 01/19/2020   Hyperprolactinemia ([place]) 11/09/2019    Past Surgical History:  Procedure Laterality Date   ABDOMINAL HYSTERECTOMY     ADRENALECTOMY     APPENDECTOMY N/A    Phreesia 01/19/2020   CESAREAN SECTION N/A    Phreesia 01/19/2020     PHYSICAL EXAM:  VS: BP 108/68 (BP Location: Left Arm, Patient Position: Sitting)    Ht 5\' 2"  (1.575 m)    Wt 167 lb (75.8 kg)    BMI 30.54 kg/m  Physical Exam Gen: NAD, alert, cooperative with exam, well-appearing MSK:  Neurovascularly intact       ASSESSMENT & PLAN:   Strain of lumbar region Has had improvement of her pain. -Counseled on home exercise therapy and supportive care. -Could consider physical therapy..  Sacroiliac joint dysfunction of left side Has some pain that localizes to the SI joints. -Counseled on home exercise therapy and supportive care. -Could consider SI joint injections.

## 2021-08-27 NOTE — Assessment & Plan Note (Signed)
Has some pain that localizes to the SI joints. -Counseled on home exercise therapy and supportive care. -Could consider SI joint injections.

## 2021-09-11 ENCOUNTER — Other Ambulatory Visit: Payer: Self-pay

## 2021-09-11 ENCOUNTER — Encounter: Payer: Self-pay | Admitting: Endocrinology

## 2021-09-11 ENCOUNTER — Ambulatory Visit (INDEPENDENT_AMBULATORY_CARE_PROVIDER_SITE_OTHER): Payer: 59 | Admitting: Endocrinology

## 2021-09-11 VITALS — BP 100/70 | HR 40 | Ht 62.0 in | Wt 170.2 lb

## 2021-09-11 DIAGNOSIS — E236 Other disorders of pituitary gland: Secondary | ICD-10-CM

## 2021-09-11 DIAGNOSIS — E221 Hyperprolactinemia: Secondary | ICD-10-CM

## 2021-09-11 NOTE — Progress Notes (Signed)
Patient ID: Jody Heath, female   DOB: March 15, 1971, 51 y.o.   MRN: [id number]              Chief complaint: Follow-up of high prolactin  History of Present Illness  Prior history: She apparently had problems with milk discharge from her breasts in  2013 and was found to have high prolactin level of about 130 She was diagnosed in [place] In 2016 her MRI scan showed a nonenhancing mass measuring 4x 5 mm She thinks that with taking cabergoline half tablet twice a week her prolactin level had come down and after about 1-1/2 years the medication was stopped but she did not have a follow-up subsequently  Recent history:  In 3/21 her prolactin level was 109 on her initial visit She had not complained of any spontaneous milky secretions from her breasts at that time  She has had a hysterectomy and cannot assess menstrual cycles  Because of significant hyperprolactinemia she was started on cabergoline in 11/2019 She has been continued on cabergoline 0.25 mg  Since her prolactin was down to 3.4 she has been only on a weekly dose of cabergoline 0.25 mg since 10/2020  Again has not noticed any spontaneous milky secretions but she usually does not try to check herself She thinks that last summer she had some hot flashes for 2 or 3 months but not now  In 11/22 her prolactin level is 12.8, previously at 3.4,  Prolactin levels:  Lab Results  Component Value Date   PROLACTIN 12.8 07/13/2021   PROLACTIN 15.8 01/06/2021   PROLACTIN 3.4 (L) 10/23/2020   PROLACTIN 11.5 05/07/2020    Since previous MRI was not available repeat imaging was done MRI of pituitary gland showed the following results  MRI of pituitary gland, 12/26/2019:  Asymmetric pituitary gland mostly flattened against the sellar floor with asymmetric contrast enhancement along the medial wall of the left cavernous sinus.  No well defined hypoenhancing lesion identified.   Report from 2018 not available  She has no  other evidence of hypopituitarism Last thyroid functions as of 10/22  Lab Results  Component Value Date   TSH 4.24 06/01/2021   TSH 3.930 11/02/2019   FREET4 0.73 06/01/2021   FREET4 0.74 12/31/2019     Allergies as of 09/11/2021       Reactions   Iodine Hives        Medication List        Accurate as of September 11, 2021  1:28 PM. If you have any questions, ask your nurse or doctor.          cabergoline 0.5 MG tablet Commonly known as: DOSTINEX TAKE 1/2 TABLETS (0.25 MG TOTAL) BY MOUTH 2 (TWO) TIMES A WEEK.   lidocaine 5 % Commonly known as: Lidoderm Place 1 patch onto the skin daily. Remove & Discard patch within 12 hours or as directed by MD   methocarbamol 500 MG tablet Commonly known as: ROBAXIN Take 1 tablet (500 mg total) by mouth 2 (two) times daily.   naproxen 500 MG tablet Commonly known as: NAPROSYN Take 1 tablet (500 mg total) by mouth 2 (two) times daily.   Vitamin D (Ergocalciferol) 1.25 MG (50000 UNIT) Caps capsule Commonly known as: DRISDOL Take 1 capsule (50,000 Units total) by mouth every 7 (seven) days.        Allergies:  Allergies  Allergen Reactions   Iodine Hives    Past Medical History:  Diagnosis Date   Anemia  Phreesia 01/19/2020   Hyperprolactinemia ([place]) 11/09/2019    Past Surgical History:  Procedure Laterality Date   ABDOMINAL HYSTERECTOMY     ADRENALECTOMY     APPENDECTOMY N/A    Phreesia 01/19/2020   CESAREAN SECTION N/A    Phreesia 01/19/2020    Family History  Problem Relation Age of Onset   Diabetes Mother    Stroke Mother    Stroke Paternal Uncle    Kidney failure Father     Social History:  reports that she has never smoked. She has never used smokeless tobacco. She reports current alcohol use. She reports that she does not use drugs.   Review of Systems  She has vitamin D deficiency on routine testing and is being treated by PCP   No history of headaches recently  EXAM:  BP 100/70 (BP  Location: Left Arm, Patient Position: Sitting, Cuff Size: Normal)    Pulse (!) 40    Ht 5\' 2"  (1.575 m)    Wt 170 lb 3.2 oz (77.2 kg)    SpO2 99%    BMI 31.13 kg/m   Physical Exam   ASSESSMENT:     Prolactinoma with baseline hyperprolactinemia with a level of 109 MRI of the pituitary gland in 5/21 suggested empty sella syndrome although she had a 5 mm prolactinoma seen in 2016 previously  She is now only on 0.25 mg cabergoline weekly with good control of her prolactin No spontaneous galactorrhea noticed by her Unable to assess her menstrual cycles because of hysterectomy Prolactin is normal in 11/22 at 12.8, previously 15.8     PLAN:     Consider weekly Dostinex  Not clear if she had menopausal symptoms last summer and will need to check estradiol and [place] levels on the next visit Also periodically check free T4 level with her empty sella syndrome Follow-up in 5/23  Elayne Snare 09/11/21   Note: This office note was prepared with Dragon voice recognition system technology. Any transcriptional errors that result from this process are unintentional.

## 2021-09-30 ENCOUNTER — Other Ambulatory Visit: Payer: Self-pay | Admitting: Endocrinology

## 2021-10-15 ENCOUNTER — Other Ambulatory Visit: Payer: Self-pay | Admitting: Endocrinology

## 2021-12-16 ENCOUNTER — Other Ambulatory Visit: Payer: Self-pay | Admitting: Endocrinology

## 2021-12-29 ENCOUNTER — Other Ambulatory Visit: Payer: Self-pay | Admitting: Endocrinology

## 2022-01-01 ENCOUNTER — Other Ambulatory Visit (INDEPENDENT_AMBULATORY_CARE_PROVIDER_SITE_OTHER): Payer: 59

## 2022-01-01 DIAGNOSIS — E236 Other disorders of pituitary gland: Secondary | ICD-10-CM

## 2022-01-01 DIAGNOSIS — E221 Hyperprolactinemia: Secondary | ICD-10-CM | POA: Diagnosis not present

## 2022-01-01 LAB — FOLLICLE STIMULATING HORMONE: FSH: 35.9 m[IU]/mL

## 2022-01-01 LAB — T4, FREE: Free T4: 0.85 ng/dL (ref 0.60–1.60)

## 2022-01-08 ENCOUNTER — Encounter: Payer: Self-pay | Admitting: Endocrinology

## 2022-01-08 ENCOUNTER — Ambulatory Visit (INDEPENDENT_AMBULATORY_CARE_PROVIDER_SITE_OTHER): Payer: 59 | Admitting: Endocrinology

## 2022-01-08 VITALS — BP 110/68 | HR 75 | Ht 62.0 in | Wt 172.6 lb

## 2022-01-08 DIAGNOSIS — D352 Benign neoplasm of pituitary gland: Secondary | ICD-10-CM

## 2022-01-08 DIAGNOSIS — E221 Hyperprolactinemia: Secondary | ICD-10-CM

## 2022-01-08 NOTE — Progress Notes (Unsigned)
Patient ID: Jody Heath, female   DOB: [birth year], 51 y.o.   MRN: [id number]              Chief complaint: Follow-up of high prolactin  History of Present Illness  Prior history: She apparently had problems with milk discharge from her breasts in  2013 and was found to have high prolactin level of about 130 She was diagnosed in [place] In 2016 her MRI scan showed a nonenhancing mass measuring 4x 5 mm She thinks that with taking cabergoline half tablet twice a week her prolactin level had come down and after about 1-1/2 years the medication was stopped but she did not have a follow-up subsequently  Recent history:  In 3/21 her prolactin level was 109 on her initial visit She had not complained of any spontaneous milky secretions from her breasts at that time  She has had a hysterectomy and cannot assess menstrual cycles  Because of significant hyperprolactinemia she was started on cabergoline in 11/2019 She has been continued on cabergoline 0.25 mg, now taking weekly  For some time has not noticed any spontaneous milky secretions but she usually does not try to check herself  She has had some hot flushes again and more consistent, has mild to moderate symptoms only [place] indicates likely menopause   Prolactin levels:  Lab Results  Component Value Date   PROLACTIN 8.4 01/01/2022   PROLACTIN 12.8 07/13/2021   PROLACTIN 15.8 01/06/2021   PROLACTIN 3.4 (L) 10/23/2020    Since previous MRI was not available repeat imaging was done MRI of pituitary gland showed the following results  MRI of pituitary gland, 12/26/2019:  Asymmetric pituitary gland mostly flattened against the sellar floor with asymmetric contrast enhancement along the medial wall of the left cavernous sinus.  No well defined hypoenhancing lesion identified.   Report from 2018 not available  She has no other evidence of hypopituitarism Last thyroid functions also normal  Lab Results  Component Value  Date   TSH 4.24 06/01/2021   TSH 3.930 11/02/2019   FREET4 0.85 01/01/2022   FREET4 0.73 06/01/2021   FREET4 0.74 12/31/2019     Allergies as of 01/08/2022       Reactions   Iodine Hives        Medication List        Accurate as of Jan 08, 2022 10:33 AM. If you have any questions, ask your nurse or doctor.          cabergoline 0.5 MG tablet Commonly known as: DOSTINEX TAKE 1/2 TABLETS BY MOUTH 2 TIMES A WEEK.   lidocaine 5 % Commonly known as: Lidoderm Place 1 patch onto the skin daily. Remove & Discard patch within 12 hours or as directed by MD   methocarbamol 500 MG tablet Commonly known as: ROBAXIN Take 1 tablet (500 mg total) by mouth 2 (two) times daily.   naproxen 500 MG tablet Commonly known as: NAPROSYN Take 1 tablet (500 mg total) by mouth 2 (two) times daily.   Vitamin D (Ergocalciferol) 1.25 MG (50000 UNIT) Caps capsule Commonly known as: DRISDOL Take 1 capsule (50,000 Units total) by mouth every 7 (seven) days.        Allergies:  Allergies  Allergen Reactions   Iodine Hives    Past Medical History:  Diagnosis Date   Anemia    Phreesia 01/19/2020   Hyperprolactinemia ([place]) 11/09/2019    Past Surgical History:  Procedure Laterality Date   ABDOMINAL HYSTERECTOMY  ADRENALECTOMY     APPENDECTOMY N/A    Phreesia 01/19/2020   CESAREAN SECTION N/A    Phreesia 01/19/2020    Family History  Problem Relation Age of Onset   Diabetes Mother    Stroke Mother    Stroke Paternal Uncle    Kidney failure Father     Social History:  reports that she has never smoked. She has never used smokeless tobacco. She reports current alcohol use. She reports that she does not use drugs.   Review of Systems  She has vitamin D deficiency on routine testing and is being treated by PCP   No history of headaches recently  EXAM:  BP 110/68 (BP Location: Left Arm, Patient Position: Sitting, Cuff Size: Normal)   Pulse 75   Ht '5\' 2"'$  (1.575 m)    Wt 172 lb 9.6 oz (78.3 kg)   SpO2 97%   BMI 31.57 kg/m   Physical Exam   ASSESSMENT:     Prolactinoma with baseline hyperprolactinemia with a level of 109 MRI of the pituitary gland in 5/21 suggested empty sella syndrome although she had a 5 mm prolactinoma seen in 2016 previously  She is now only on 0.25 mg cabergoline weekly with consistent improvement in prolactin and gradual decrease in the level No spontaneous galactorrhea present again Unable to assess her menstrual cycles because of hysterectomy  With her hot flashes an [facility] of about 25 she is likely menopause     PLAN:     If estradiol is also low as expected her Dostinex can be stopped and she can follow-up in 2 months  Elayne Snare 01/08/22   Note: This office note was prepared with Dragon voice recognition system technology. Any transcriptional errors that result from this process are unintentional.

## 2022-01-11 LAB — ESTRADIOL, FREE
Estradiol, Serum, MS: 27 pg/mL
Free Estradiol, Percent: 2.4 %
Free Estradiol, Serum: 0.65 pg/mL

## 2022-01-11 LAB — PROLACTIN: Prolactin: 8.4 ng/mL (ref 4.8–23.3)

## 2022-01-18 ENCOUNTER — Ambulatory Visit (HOSPITAL_BASED_OUTPATIENT_CLINIC_OR_DEPARTMENT_OTHER)
Admission: RE | Admit: 2022-01-18 | Discharge: 2022-01-18 | Disposition: A | Discharge status: Home / Self Care | Payer: 59 | Source: Ambulatory Visit | Attending: Medical | Admitting: Medical

## 2022-01-18 ENCOUNTER — Other Ambulatory Visit (HOSPITAL_BASED_OUTPATIENT_CLINIC_OR_DEPARTMENT_OTHER): Payer: Self-pay

## 2022-01-18 ENCOUNTER — Ambulatory Visit (INDEPENDENT_AMBULATORY_CARE_PROVIDER_SITE_OTHER): Payer: 59 | Admitting: Medical

## 2022-01-18 VITALS — BP 110/60 | HR 64 | Resp 18 | Ht 62.0 in | Wt 177.8 lb

## 2022-01-18 DIAGNOSIS — R002 Palpitations: Secondary | ICD-10-CM | POA: Diagnosis not present

## 2022-01-18 DIAGNOSIS — M25512 Pain in left shoulder: Secondary | ICD-10-CM

## 2022-01-18 DIAGNOSIS — G8929 Other chronic pain: Secondary | ICD-10-CM | POA: Diagnosis not present

## 2022-01-18 DIAGNOSIS — R0609 Other forms of dyspnea: Secondary | ICD-10-CM

## 2022-01-18 MED ORDER — ALBUTEROL SULFATE HFA 108 (90 BASE) MCG/ACT IN AERS
2.0000 | INHALATION_SPRAY | Freq: Four times a day (QID) | RESPIRATORY_TRACT | 0 refills | Status: DC | PRN
  Filled 2022-01-18: qty 6.7, 25d supply, fill #0

## 2022-01-18 NOTE — Progress Notes (Addendum)
Subjective:    Patient ID: Jody Heath, female    DOB: 04-Apr-1971, 51 y.o.   MRN: [id number]  HPI  Pt in with left shoulder pain since march, 2023. Pt states pain has gotten some worse. Shoulder feels stiffer over past months. No fall or injury. Pt is rt handed.   Pt has not been taking any meds for pain.   Pt states some sensation of shortness of breath for 2 weeks intermittently on acitvity. .  No leg pain. Get short of breath easily on walking for few minutes.  No history of asthma. She does not smoker   Pt mentioned remote history of heart diagnosis dx in [place]. Pt can't remember what diagnosis. On further discussion states history of palpitations.    Review of Systems  Constitutional:  Negative for chills, fatigue and fever.  HENT:  Negative for drooling and mouth sores.   Respiratory:  Positive for shortness of breath. Negative for cough and wheezing.   Cardiovascular:  Negative for chest pain and palpitations.  Gastrointestinal:  Negative for abdominal pain, blood in stool and constipation.  Genitourinary:  Negative for difficulty urinating, dysuria, flank pain, frequency and urgency.  Musculoskeletal:  Negative for back pain.    Past Medical History:  Diagnosis Date   Anemia    Phreesia 01/19/2020   Hyperprolactinemia ([place]) 11/09/2019     Social History   Socioeconomic History   Marital status: Married    Spouse name: Not on file   Number of children: 2   Years of education: Not on file   Highest education level: Not on file  Occupational History   Not on file  Tobacco Use   Smoking status: Never   Smokeless tobacco: Never  Vaping Use   Vaping Use: Never used  Substance and Sexual Activity   Alcohol use: Yes    Comment: occ   Drug use: Never   Sexual activity: Not Currently  Other Topics Concern   Not on file  Social History Narrative   Not on file   Social Determinants of Health   Financial Resource Strain: Not on file  Food  Insecurity: Not on file  Transportation Needs: Not on file  Physical Activity: Not on file  Stress: Not on file  Social Connections: Not on file  Intimate Partner Violence: Not on file    Past Surgical History:  Procedure Laterality Date   ABDOMINAL HYSTERECTOMY     ADRENALECTOMY     APPENDECTOMY N/A    Phreesia 01/19/2020   CESAREAN SECTION N/A    Phreesia 01/19/2020    Family History  Problem Relation Age of Onset   Diabetes Mother    Stroke Mother    Stroke Paternal Uncle    Kidney failure Father     Allergies  Allergen Reactions   Iodine Hives    Current Outpatient Medications on File Prior to Visit  Medication Sig Dispense Refill   cabergoline (DOSTINEX) 0.5 MG tablet TAKE 1/2 TABLETS BY MOUTH 2 TIMES A WEEK. 90 tablet 1   lidocaine (LIDODERM) 5 % Place 1 patch onto the skin daily. Remove & Discard patch within 12 hours or as directed by MD 30 patch 0   methocarbamol (ROBAXIN) 500 MG tablet Take 1 tablet (500 mg total) by mouth 2 (two) times daily. 20 tablet 0   naproxen (NAPROSYN) 500 MG tablet Take 1 tablet (500 mg total) by mouth 2 (two) times daily. 30 tablet 0   Vitamin D, Ergocalciferol, (DRISDOL) 1.25 MG (  50000 UNIT) CAPS capsule Take 1 capsule (50,000 Units total) by mouth every 7 (seven) days. 12 capsule 0   No current facility-administered medications on file prior to visit.    BP 110/60   Pulse 64   Resp 18   Ht '5\' 2"'$  (1.575 m)   Wt 177 lb 12.8 oz (80.6 kg)   SpO2 98%   BMI 32.52 kg/m     Past Medical History:  Diagnosis Date   Anemia    Phreesia 01/19/2020   Hyperprolactinemia ([place]) 11/09/2019     Social History   Socioeconomic History   Marital status: Married    Spouse name: Not on file   Number of children: 2   Years of education: Not on file   Highest education level: Not on file  Occupational History   Not on file  Tobacco Use   Smoking status: Never   Smokeless tobacco: Never  Vaping Use   Vaping Use: Never used   Substance and Sexual Activity   Alcohol use: Yes    Comment: occ   Drug use: Never   Sexual activity: Not Currently  Other Topics Concern   Not on file  Social History Narrative   Not on file   Social Determinants of Health   Financial Resource Strain: Not on file  Food Insecurity: Not on file  Transportation Needs: Not on file  Physical Activity: Not on file  Stress: Not on file  Social Connections: Not on file  Intimate Partner Violence: Not on file    Past Surgical History:  Procedure Laterality Date   ABDOMINAL HYSTERECTOMY     ADRENALECTOMY     APPENDECTOMY N/A    Phreesia 01/19/2020   CESAREAN SECTION N/A    Phreesia 01/19/2020    Family History  Problem Relation Age of Onset   Diabetes Mother    Stroke Mother    Stroke Paternal Uncle    Kidney failure Father     Allergies  Allergen Reactions   Iodine Hives    Current Outpatient Medications on File Prior to Visit  Medication Sig Dispense Refill   cabergoline (DOSTINEX) 0.5 MG tablet TAKE 1/2 TABLETS BY MOUTH 2 TIMES A WEEK. 90 tablet 1   lidocaine (LIDODERM) 5 % Place 1 patch onto the skin daily. Remove & Discard patch within 12 hours or as directed by MD 30 patch 0   methocarbamol (ROBAXIN) 500 MG tablet Take 1 tablet (500 mg total) by mouth 2 (two) times daily. 20 tablet 0   naproxen (NAPROSYN) 500 MG tablet Take 1 tablet (500 mg total) by mouth 2 (two) times daily. 30 tablet 0   Vitamin D, Ergocalciferol, (DRISDOL) 1.25 MG (50000 UNIT) CAPS capsule Take 1 capsule (50,000 Units total) by mouth every 7 (seven) days. 12 capsule 0   No current facility-administered medications on file prior to visit.    BP 110/60   Pulse 64   Resp 18   Ht '5\' 2"'$  (1.575 m)   Wt 177 lb 12.8 oz (80.6 kg)   SpO2 98%   BMI 32.52 kg/m       Objective:   Physical Exam   General Mental Status- Alert. General Appearance- Not in acute distress.   Skin General: Color- Normal Color. Moisture- Normal  Moisture.  Neck Carotid Arteries- Normal color. Moisture- Normal Moisture. No carotid bruits. No JVD.  Chest and Lung Exam Auscultation: Breath Sounds:-Normal.  Cardiovascular Auscultation:Rythm- Regular. Murmurs & Other Heart Sounds:Auscultation of the heart reveals- No Murmurs.  Abdomen Inspection:-Inspeection Normal. Palpation/Percussion:Note:No mass. Palpation and Percussion of the abdomen reveal- Non Tender, Non Distended + BS, no rebound or guarding.    Neurologic Cranial Nerve exam:- CN III-XII intact(No nystagmus), symmetric smile. Strength:- 5/5 equal and symmetric strength both upper and lower extremities.      Assessment & Plan:   Patient Instructions  2 months of left shoulder pain.  Some anterior aspect tenderness to palpation and reduced range of motion.  Also some dyspnea on exertion noted and history of palpitations this pain in the shoulder does not appear cardiac like.  Considering rotator cuff injury versus frozen shoulder based on exam.  Advising can use combination of Tylenol 500 milligrams and lower dose ibuprofen 400 mg every 8 hours.  Went ahead and placed referral to sports medicine at the med center.  Mild intermittent dyspnea on exertion.  Calf symmetric with negative Homans' sign.  Lungs clear even unlabored.  We will get a chest x-ray today.  We will make albuterol inhaler available to see if this resolves intermittent symptoms.  History of palpitations.  Decided to get EKG.  EKG showed normal sinus rhythm.  Decided to go ahead and make referral to cardiologist for probable Zio patch.  Evaluate and treat.  Follow-up in 7 to 10 days with myself in office or sooner if needed.  Any severe worsening signs or symptoms after hours recommend ED evaluation.   Mackie Pai, PA-C   Mackie Pai, PA-C

## 2022-01-18 NOTE — Patient Instructions (Addendum)
2 months of left shoulder pain.  Some anterior aspect tenderness to palpation and reduced range of motion.  Also some dyspnea on exertion noted and history of palpitations this pain in the shoulder does not appear cardiac like.  Considering rotator cuff injury versus frozen shoulder based on exam.  Advising can use combination of Tylenol 500 milligrams and lower dose ibuprofen 400 mg every 8 hours.  Went ahead and placed referral to sports medicine at the med center.  Mild intermittent dyspnea on exertion.  Calf symmetric with negative Homans' sign.  Lungs clear even unlabored.  We will get a chest x-ray today.  We will make albuterol inhaler available to see if this resolves intermittent symptoms.  History of palpitations.  Decided to get EKG.  EKG showed normal sinus rhythm.  Decided to go ahead and make referral to cardiologist for probable Zio patch.  Evaluate and treat.  Follow-up in 7 to 10 days with myself in office or sooner if needed.  Any severe worsening signs or symptoms after hours recommend ED evaluation.

## 2022-01-25 ENCOUNTER — Ambulatory Visit (INDEPENDENT_AMBULATORY_CARE_PROVIDER_SITE_OTHER): Payer: 59 | Admitting: Family Medicine

## 2022-01-25 VITALS — BP 104/60 | Ht 63.0 in | Wt 175.0 lb

## 2022-01-25 DIAGNOSIS — M7502 Adhesive capsulitis of left shoulder: Secondary | ICD-10-CM

## 2022-01-25 HISTORY — DX: Adhesive capsulitis of left shoulder: M75.02

## 2022-01-25 NOTE — Progress Notes (Signed)
  Jody Heath - 51 y.o. female MRN [id number]  Date of birth: 1970-10-06  SUBJECTIVE:  Including CC & ROS.  No chief complaint on file.   Jody Heath is a 51 y.o. female that is presenting with acute left shoulder pain.  The pain is occurring at the shoulder and around the scapula.  The symptoms been present for about 2 months.  Denies any injury inciting event.  Symptoms seem to be getting worse.  No history of surgery or similar symptoms.  Review of the office note from 6/5 shows she was counseled on ibuprofen and Tylenol.   Review of Systems See HPI   HISTORY: Past Medical, Surgical, Social, and Family History Reviewed & Updated per EMR.   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  Significant limited with external rotation when compared to contralateral side. -Counseled on home exercise therapy and supportive care. -Referral to physical therapy. -Could consider injection.

## 2022-01-25 NOTE — Patient Instructions (Signed)
Good to see you Please try heat  Please try the exercises  We have made a referral to physical therapy   Please send me a message in MyChart with any questions or updates.  Please see me back in 4 weeks.   --Dr. Raeford Razor   ???? ???? ?? ?? ???? ???? ???? ????? ??????? ???? ?? ???? ???? ???? ??????? ????? ?????? ????? ??? ????? ?? ?MyChart ??? ????? ????? ????? ?4 ????? ??? ???? ?????

## 2022-01-25 NOTE — Assessment & Plan Note (Signed)
Acutely occurring.  Significant limited with external rotation when compared to contralateral side. -Counseled on home exercise therapy and supportive care. -Referral to physical therapy. -Could consider injection.

## 2022-01-26 ENCOUNTER — Ambulatory Visit (INDEPENDENT_AMBULATORY_CARE_PROVIDER_SITE_OTHER): Payer: 59 | Admitting: Medical

## 2022-01-26 VITALS — BP 104/63 | HR 67 | Temp 97.9°F | Resp 16 | Ht 63.0 in | Wt 173.0 lb

## 2022-01-26 DIAGNOSIS — M7502 Adhesive capsulitis of left shoulder: Secondary | ICD-10-CM

## 2022-01-26 DIAGNOSIS — R1030 Lower abdominal pain, unspecified: Secondary | ICD-10-CM

## 2022-01-26 DIAGNOSIS — D72819 Decreased white blood cell count, unspecified: Secondary | ICD-10-CM

## 2022-01-26 DIAGNOSIS — R002 Palpitations: Secondary | ICD-10-CM | POA: Diagnosis not present

## 2022-01-26 DIAGNOSIS — Z1231 Encounter for screening mammogram for malignant neoplasm of breast: Secondary | ICD-10-CM

## 2022-01-26 DIAGNOSIS — R1032 Left lower quadrant pain: Secondary | ICD-10-CM

## 2022-01-26 LAB — CBC WITH DIFFERENTIAL/PLATELET
Basophils Absolute: 0 10*3/uL (ref 0.0–0.1)
Basophils Relative: 0.2 % (ref 0.0–3.0)
Eosinophils Absolute: 0.2 10*3/uL (ref 0.0–0.7)
Eosinophils Relative: 5 % (ref 0.0–5.0)
HCT: 40.6 % (ref 36.0–46.0)
Hemoglobin: 13.8 g/dL (ref 12.0–15.0)
Lymphocytes Relative: 36.4 % (ref 12.0–46.0)
Lymphs Abs: 1.3 10*3/uL (ref 0.7–4.0)
MCHC: 33.9 g/dL (ref 30.0–36.0)
MCV: 88.7 fl (ref 78.0–100.0)
Monocytes Absolute: 0.3 10*3/uL (ref 0.1–1.0)
Monocytes Relative: 7.5 % (ref 3.0–12.0)
Neutro Abs: 1.9 10*3/uL (ref 1.4–7.7)
Neutrophils Relative %: 50.9 % (ref 43.0–77.0)
Platelets: 172 10*3/uL (ref 150.0–400.0)
RBC: 4.58 Mil/uL (ref 3.87–5.11)
RDW: 12.3 % (ref 11.5–15.5)
WBC: 3.6 10*3/uL — ABNORMAL LOW (ref 4.0–10.5)

## 2022-01-26 LAB — COMPREHENSIVE METABOLIC PANEL
ALT: 22 U/L (ref 0–35)
AST: 18 U/L (ref 0–37)
Albumin: 4.4 g/dL (ref 3.5–5.2)
Alkaline Phosphatase: 63 U/L (ref 39–117)
BUN: 9 mg/dL (ref 6–23)
CO2: 29 mEq/L (ref 19–32)
Calcium: 9.8 mg/dL (ref 8.4–10.5)
Chloride: 103 mEq/L (ref 96–112)
Creatinine, Ser: 0.7 mg/dL (ref 0.40–1.20)
GFR: 101.39 mL/min (ref >60.00)
Glucose, Bld: 89 mg/dL (ref 70–99)
Potassium: 4.1 mEq/L (ref 3.5–5.1)
Sodium: 138 mEq/L (ref 135–145)
Total Bilirubin: 0.5 mg/dL (ref 0.2–1.2)
Total Protein: 7.4 g/dL (ref 6.0–8.3)

## 2022-01-26 NOTE — Patient Instructions (Addendum)
For frozen shoulder- follow thru with exercises/PT.  For recurrent palpitations more on day of exercise. Referral to cardiologist pending. During interim recommend no strenuos exercise.  Get cbc today for low wbc and platelets.  For chronic intermittent abdomen pain worse llq and moderate to severe last week will order ct abd /pelvis with contrast. Pain may be related to diverticulitis.   Follow up in 2-3 weeks or sooner if needed.

## 2022-01-26 NOTE — Progress Notes (Signed)
Subjective:    Patient ID: Jody Heath, female    DOB: [birth year], 51 y.o.   MRN: [id number]  HPI  Pt in for follow up.  Pt went to Dr. Raeford Razor. Pt dx with frozen shoulder. She has started PT.  Pt had some palpitation on last visit.ekg done and nsr. Had placed referral to cardiologist and she has appointment March 16, 2022. With interpretor she states palpitation will occur often in afternoon on day she goes for long walk or exercises.  Pt had some mild shortness of breath on exertion. She tells me albuterol did not make difference. However she thinks that has resolved. Pt chest xray was normal. She clarifies that if she jogs for one hour 3-4 miles later in that day will have some shortness of breath.  Pt wants mammogram. Last was 6 years ago. Last result was normal. Done in [place]. Pt thinks has copy.  Pt also wanted to have repeat wbc and platelet level.    5 years ago pt had multiple surgeries. Pt has lower abd/pelvic area pain. Back then she had appendectomy. Has listed rt partial ovary removed. Left ovary removed completely. 6 months after surgery she has pain that comes and goes. She states also had adrenal partial adrenal gland removed as states cyst were pressing again adrenal gland per pt.   Also prior to above surgery other surgeries.  Low level pain 3-4 days out of the week.  Also had uterus removed at time of surgery.   Pt states most of time when has lower abdomen area pain is on left side. But some on lower rt side.  Last time had pain one week ago and was in left lower quadrant.  8-9 months ago pain in left lower quadrant became more fequent and higher level. Sometimes 8-9/10.   Review of Systems  Constitutional:  Negative for chills, fatigue and fever.  Respiratory:  Negative for cough, chest tightness and wheezing.   Cardiovascular:  Positive for palpitations. Negative for chest pain.  Gastrointestinal:  Negative for abdominal distention, abdominal  pain, constipation, diarrhea and vomiting.  Genitourinary:        Lower abdomen/pelvic area pain. Mild intermittent.   Musculoskeletal:  Negative for back pain and neck pain.       Frozen shoulder.  Skin:  Negative for rash.  Neurological:  Negative for dizziness, seizures, numbness and headaches.  Hematological:  Negative for adenopathy. Does not bruise/bleed easily.  Psychiatric/Behavioral:  Negative for behavioral problems, decreased concentration and dysphoric mood.     Past Medical History:  Diagnosis Date   Anemia    [facility]reesia 01/19/2020   Hyperprolactinemia ([place]) 11/09/2019     Social History   Socioeconomic History   Marital status: Married    Spouse name: Not on file   Number of children: 2   Years of education: Not on file   Highest education level: Not on file  Occupational History   Not on file  Tobacco Use   Smoking status: Never   Smokeless tobacco: Never  Vaping Use   Vaping Use: Never used  Substance and Sexual Activity   Alcohol use: Yes    Comment: occ   Drug use: Never   Sexual activity: Not Currently  Other Topics Concern   Not on file  Social History Narrative   Not on file   Social Determinants of Health   Financial Resource Strain: Not on file  Food Insecurity: Not on file  Transportation Needs: Not on file  Physical Activity: Not on file  Stress: Not on file  Social Connections: Not on file  Intimate Partner Violence: Not on file    Past Surgical History:  Procedure Laterality Date   ABDOMINAL HYSTERECTOMY     ADRENALECTOMY     APPENDECTOMY N/A    [facility]reesia 01/19/2020   CESAREAN SECTION N/A    [facility]reesia 01/19/2020    Family History  Problem Relation Age of Onset   Diabetes Mother    Stroke Mother    Stroke Paternal Uncle    Kidney failure Father     Allergies  Allergen Reactions   Iodine Hives    Current Outpatient Medications on File Prior to Visit  Medication Sig Dispense Refill   albuterol (VENTOLIN HFA) 108 (90  Base) MCG/ACT inhaler Inhale 2 puffs into the lungs every 6 (six) hours as needed. 6.7 g 0   cabergoline (DOSTINEX) 0.5 MG tablet TAKE 1/2 TABLETS BY MOUTH 2 TIMES A WEEK. 90 tablet 1   lidocaine (LIDODERM) 5 % Place 1 patch onto the skin daily. Remove & Discard patch within 12 hours or as directed by MD 30 patch 0   methocarbamol (ROBAXIN) 500 MG tablet Take 1 tablet (500 mg total) by mouth 2 (two) times daily. 20 tablet 0   naproxen (NAPROSYN) 500 MG tablet Take 1 tablet (500 mg total) by mouth 2 (two) times daily. 30 tablet 0   Vitamin D, Ergocalciferol, (DRISDOL) 1.25 MG (50000 UNIT) CAPS capsule Take 1 capsule (50,000 Units total) by mouth every 7 (seven) days. 12 capsule 0   No current facility-administered medications on file prior to visit.    BP 104/63 (BP Location: Right Arm, Patient Position: Sitting, Cuff Size: Normal)   Pulse 67   Temp 97.9 F (36.6 C) (Oral)   Resp 16   Ht '5\' 3"'$  (1.6 m)   Wt 173 lb (78.5 kg)   SpO2 98%   BMI 30.65 kg/m        Objective:   Physical Exam  General Mental Status- Alert. General Appearance- Not in acute distress.   Skin General: Color- Normal Color. Moisture- Normal Moisture.  Neck Carotid Arteries- Normal color. Moisture- Normal Moisture. No carotid bruits. No JVD.  Chest and Lung Exam Auscultation: Breath Sounds:-Normal.  Cardiovascular Auscultation:Rythm- Regular. Murmurs & Other Heart Sounds:Auscultation of the heart reveals- No Murmurs.  Abdomen Inspection:-Inspeection Normal. Palpation/Percussion:Note:No mass. Palpation and Percussion of the abdomen reveal- mild llq Tender, Non Distended + BS, no rebound or guarding.    Neurologic Cranial Nerve exam:- CN III-XII intact(No nystagmus), symmetric smile. Strength:- 5/5 equal and symmetric strength both upper and lower extremities.       Assessment & Plan:   Patient Instructions  For frozen shoulder- follow thru with exercises/PT.  For recurrent palpitations more  on day of exercise. Referral to cardiologist pending. During interim recommend no strenuos exercise.  Get cbc today for low wbc and platlets.  For chronic intermittent abdomen pain worse llq and moderate to severe last week will order ct abd /pelvis with contast. Pain may be related to diverticulitis.   Follow up in 2-3 weeks or sooner if needed.   Mackie Pai, PA-C   Time spent with patient today was 46  minutes which consisted of chart revdiew, discussing diagnoses, work up, treatment (thur interpretor) and documentation.

## 2022-02-03 ENCOUNTER — Encounter (HOSPITAL_BASED_OUTPATIENT_CLINIC_OR_DEPARTMENT_OTHER): Payer: Self-pay

## 2022-02-03 ENCOUNTER — Ambulatory Visit (HOSPITAL_BASED_OUTPATIENT_CLINIC_OR_DEPARTMENT_OTHER)
Admission: RE | Admit: 2022-02-03 | Discharge: 2022-02-03 | Disposition: A | Discharge status: Home / Self Care | Payer: 59 | Source: Ambulatory Visit | Attending: Medical | Admitting: Medical

## 2022-02-03 DIAGNOSIS — R1032 Left lower quadrant pain: Secondary | ICD-10-CM | POA: Diagnosis not present

## 2022-02-03 MED ORDER — IOHEXOL 300 MG/ML  SOLN
100.0000 mL | Freq: Once | INTRAMUSCULAR | Status: AC | PRN
  Administered 2022-02-03: 100 mL via INTRAVENOUS

## 2022-02-10 ENCOUNTER — Ambulatory Visit (HOSPITAL_BASED_OUTPATIENT_CLINIC_OR_DEPARTMENT_OTHER)
Admission: RE | Admit: 2022-02-10 | Discharge: 2022-02-10 | Disposition: A | Discharge status: Home / Self Care | Payer: 59 | Source: Ambulatory Visit | Attending: Medical | Admitting: Medical

## 2022-02-10 ENCOUNTER — Ambulatory Visit (INDEPENDENT_AMBULATORY_CARE_PROVIDER_SITE_OTHER): Payer: 59 | Admitting: Medical

## 2022-02-10 ENCOUNTER — Encounter (HOSPITAL_BASED_OUTPATIENT_CLINIC_OR_DEPARTMENT_OTHER): Payer: Self-pay

## 2022-02-10 ENCOUNTER — Encounter: Payer: Self-pay | Admitting: Medical

## 2022-02-10 VITALS — BP 110/70 | HR 60 | Temp 98.2°F | Resp 18 | Ht 63.0 in | Wt 175.0 lb

## 2022-02-10 DIAGNOSIS — R1030 Lower abdominal pain, unspecified: Secondary | ICD-10-CM

## 2022-02-10 DIAGNOSIS — M7502 Adhesive capsulitis of left shoulder: Secondary | ICD-10-CM | POA: Diagnosis not present

## 2022-02-10 DIAGNOSIS — R002 Palpitations: Secondary | ICD-10-CM

## 2022-02-10 DIAGNOSIS — Z1231 Encounter for screening mammogram for malignant neoplasm of breast: Secondary | ICD-10-CM | POA: Insufficient documentation

## 2022-02-10 DIAGNOSIS — K449 Diaphragmatic hernia without obstruction or gangrene: Secondary | ICD-10-CM | POA: Diagnosis not present

## 2022-02-10 NOTE — Progress Notes (Signed)
Subjective:    Patient ID: Jody Heath, female    DOB: 1971/02/15, 51 y.o.   MRN: [id number]  HPI  Pt in for follow up.  Pt states her left shoulder pain is improved but not completely. Now moderate pain. Before was severe.  She does some exercises at home and has appointment with PT July 14th.  Pt had palpitation on day of exercising. I referred to cardiologist. She has appt August 31 ,2023.  On last visit she noted below in ".  "Pt had some palpitation on last visit.ekg done and nsr. Had placed referral to cardiologist and she has appointment March 16, 2022. With interpretor she states palpitation will occur often in afternoon on day she goes for long walk or exercises"  Pt states palpitation are still occurring about every other day. Lasting 1-2 hours. She notes correlation with stress. But not reporting anxiety or panic attack.   Pt does drink caffeine beverage once or twice daily..  Pt had chronic lower abdomen pain. She noted below in ".  "5 years ago pt had multiple surgeries. Pt has lower abd/pelvic area pain. Back then she had appendectomy. Has listed rt partial ovary removed. Left ovary removed completely. 6 months after surgery she has pain that comes and goes. She states also had adrenal partial adrenal gland removed as states cyst were pressing again adrenal gland per pt.    Also prior to above surgery other surgeries.  Low level pain 3-4 days out of the week.   Also had uterus removed at time of surgery.    Pt states most of time when has lower abdomen area pain is on left side. But some on lower rt side.   Last time had pain one week ago and was in left lower quadrant.  8-9 months ago pain in left lower quadrant became more fequent and higher level. Sometimes 8-9/10."  Pt wanted imaging studies. Ct was negative except for small hiatal hernia.    Review of Systems  Constitutional:  Negative for chills, fatigue and fever.  Respiratory:  Negative for cough,  chest tightness, shortness of breath and wheezing.   Cardiovascular:  Positive for palpitations. Negative for chest pain.       Last time had was yesterday morning.  Gastrointestinal:  Negative for abdominal pain, blood in stool, constipation, diarrhea, nausea and rectal pain.  Musculoskeletal:  Negative for back pain, joint swelling and myalgias.  Skin:  Negative for rash.    Past Medical History:  Diagnosis Date   Anemia    [facility]reesia 01/19/2020   Hyperprolactinemia ([place]) 11/09/2019     Social History   Socioeconomic History   Marital status: Married    Spouse name: Not on file   Number of children: 2   Years of education: Not on file   Highest education level: Not on file  Occupational History   Not on file  Tobacco Use   Smoking status: Never   Smokeless tobacco: Never  Vaping Use   Vaping Use: Never used  Substance and Sexual Activity   Alcohol use: Yes    Comment: occ   Drug use: Never   Sexual activity: Not Currently  Other Topics Concern   Not on file  Social History Narrative   Not on file   Social Determinants of Health   Financial Resource Strain: Not on file  Food Insecurity: Not on file  Transportation Needs: Not on file  Physical Activity: Not on file  Stress: Not on file  Social Connections: Not on file  Intimate Partner Violence: Not on file    Past Surgical History:  Procedure Laterality Date   ABDOMINAL HYSTERECTOMY     ADRENALECTOMY     APPENDECTOMY N/A    [facility]ia 01/19/2020   CESAREAN SECTION N/A    [facility]reesia 01/19/2020    Family History  Problem Relation Age of Onset   Diabetes Mother    Stroke Mother    Stroke Paternal Uncle    Kidney failure Father     Allergies  Allergen Reactions   Iodine Hives    Current Outpatient Medications on File Prior to Visit  Medication Sig Dispense Refill   albuterol (VENTOLIN HFA) 108 (90 Base) MCG/ACT inhaler Inhale 2 puffs into the lungs every 6 (six) hours as needed. 6.7 g 0   cabergoline  (DOSTINEX) 0.5 MG tablet TAKE 1/2 TABLETS BY MOUTH 2 TIMES A WEEK. 90 tablet 1   lidocaine (LIDODERM) 5 % Place 1 patch onto the skin daily. Remove & Discard patch within 12 hours or as directed by MD 30 patch 0   methocarbamol (ROBAXIN) 500 MG tablet Take 1 tablet (500 mg total) by mouth 2 (two) times daily. 20 tablet 0   naproxen (NAPROSYN) 500 MG tablet Take 1 tablet (500 mg total) by mouth 2 (two) times daily. 30 tablet 0   Vitamin D, Ergocalciferol, (DRISDOL) 1.25 MG (50000 UNIT) CAPS capsule Take 1 capsule (50,000 Units total) by mouth every 7 (seven) days. 12 capsule 0   No current facility-administered medications on file prior to visit.    BP 110/70   Pulse 60   Temp 98.2 F (36.8 C)   Resp 18   Ht '5\' 3"'$  (1.6 m)   Wt 175 lb (79.4 kg)   SpO2 99%   BMI 31.00 kg/m          Objective:   Physical Exam  General Mental Status- Alert. General Appearance- Not in acute distress.   Skin General: Color- Normal Color. Moisture- Normal Moisture.  Neck Carotid Arteries- Normal color. Moisture- Normal Moisture. No carotid bruits. No JVD.  Chest and Lung Exam Auscultation: Breath Sounds:-Normal.  Cardiovascular Auscultation:Rythm- Regular. Murmurs & Other Heart Sounds:Auscultation of the heart reveals- No Murmurs.  Abdomen Inspection:-Inspeection Normal. Palpation/Percussion:Note:No mass. Palpation and Percussion of the abdomen reveal- Non Tender, Non Distended + BS, no rebound or guarding.    Neurologic Cranial Nerve exam:- CN III-XII intact(No nystagmus), symmetric smile. Strength:- 5/5 equal and symmetric strength both upper and lower extremities.       Assessment & Plan:

## 2022-02-10 NOTE — Patient Instructions (Addendum)
For palpitation recommend strict avoidance of all caffeine beverages. When you get palpitation describe 1-2 hour events every other day. I want you to get pulse oximeter monitor to check pulse when this occurs. Let me know if you note pulse lower than 60 or over 100. Reminder not to exercise until you see cardiologist. If you area getting very low or very high pulse readings may be able to get you into cardiologist sooner. Your describe low level palpitation sensation. But if severe or other associated signs/symptoms be seen in the ED pending cardiologist.  I do think your intermittent abdomen pain is related to adhesion type pain due to prior surgeries. CT study was negative except for tiny hiatal hernia. If pain changes to epigastric then consider gerd/reflux pain.  For shoulder pain/frozen shoulder continue with at home exercises and attend PT appointment.  Follow 1 month or sooner if needed.

## 2022-02-12 ENCOUNTER — Other Ambulatory Visit: Payer: Self-pay | Admitting: Medical

## 2022-02-12 DIAGNOSIS — R928 Other abnormal and inconclusive findings on diagnostic imaging of breast: Secondary | ICD-10-CM

## 2022-02-23 ENCOUNTER — Ambulatory Visit
Admission: RE | Admit: 2022-02-23 | Discharge: 2022-02-23 | Disposition: A | Discharge status: Home / Self Care | Payer: 59 | Source: Ambulatory Visit | Attending: Medical | Admitting: Medical

## 2022-02-23 ENCOUNTER — Ambulatory Visit
Admission: RE | Admit: 2022-02-23 | Discharge: 2022-02-23 | Disposition: A | Discharge status: Home / Self Care | Payer: Self-pay | Source: Ambulatory Visit | Attending: Medical | Admitting: Medical

## 2022-02-23 ENCOUNTER — Other Ambulatory Visit: Payer: Self-pay | Admitting: Medical

## 2022-02-23 DIAGNOSIS — R928 Other abnormal and inconclusive findings on diagnostic imaging of breast: Secondary | ICD-10-CM

## 2022-02-23 DIAGNOSIS — N6489 Other specified disorders of breast: Secondary | ICD-10-CM

## 2022-02-24 ENCOUNTER — Ambulatory Visit (INDEPENDENT_AMBULATORY_CARE_PROVIDER_SITE_OTHER): Payer: 59 | Admitting: Family Medicine

## 2022-02-24 ENCOUNTER — Encounter: Payer: Self-pay | Admitting: Family Medicine

## 2022-02-24 DIAGNOSIS — M7502 Adhesive capsulitis of left shoulder: Secondary | ICD-10-CM | POA: Diagnosis not present

## 2022-02-24 NOTE — Patient Instructions (Signed)
Good to see you Please try heat before exercises  Please continue with physical therapy   Please send me a message in MyChart with any questions or updates.  Please see me back in 6-8 weeks.   --Dr. Raeford Razor  ???? ???? ?? ?? ????? ???? ??? ?????? ??? ???? ???? ????? ????? ???? ???? ????? ??????? ????? ??? ????? ?? ?MyChart ????? ????? ????? ?6-8 ????? ??? ????? ??????? ?niteni lemayeti t'iru newi ibakiwoni ke'?kali bik'ati inik'isik'as? bef?ti muk'etini yimokiru ibakiwoni be'?kalaw? hikimina yik'et'ilu ibakiwoni kemaninyachewimi t'iyak'?wochi weyimi zimenawochi gari beMyChart meli'ikiti lakulinyi. ibakiwoni ke6-8 saminitati wisit'i temelisewi yimeliketunyi.

## 2022-02-24 NOTE — Progress Notes (Signed)
  Jody Heath - 51 y.o. female MRN [id number]  Date of birth: [birth year]  SUBJECTIVE:  Including CC & ROS.  No chief complaint on file.   Jody Heath is a 51 y.o. female that is following up for her left shoulder pain.  Her range of motion is still limited.  The pain has improved somewhat.  She has not started physical therapy yet.  She has been doing exercises twice a day.  A telephone interpreter was used for this interview.  Review of Systems See HPI   HISTORY: Past Medical, Surgical, Social, and Family History Reviewed & Updated per EMR.   Pertinent Historical Findings include:  Past Medical History:  Diagnosis Date   Anemia    [facility]ia 01/19/2020   Hyperprolactinemia ([place]) 11/09/2019    Past Surgical History:  Procedure Laterality Date   ABDOMINAL HYSTERECTOMY     ADRENALECTOMY     APPENDECTOMY N/A    [facility]ia 01/19/2020   CESAREAN SECTION N/A    [facility]ia 01/19/2020     PHYSICAL EXAM:  VS: BP 112/62 (BP Location: Left Arm, Patient Position: Sitting)   Ht '5\' 3"'$  (1.6 m)   Wt 175 lb (79.4 kg)   BMI 31.00 kg/m  Physical Exam Gen: NAD, alert, cooperative with exam, well-appearing MSK:  Neurovascularly intact       ASSESSMENT & PLAN:   Adhesive capsulitis of left shoulder Having improvement of pain but has not regained her range of motion yet.  Limited with abduction, external rotation and flexion. -Counseled on home exercise therapy and supportive care. -Continue with physical therapy. -Could consider an injection.

## 2022-02-24 NOTE — Assessment & Plan Note (Signed)
Having improvement of pain but has not regained her range of motion yet.  Limited with abduction, external rotation and flexion. -Counseled on home exercise therapy and supportive care. -Continue with physical therapy. -Could consider an injection.

## 2022-02-26 ENCOUNTER — Ambulatory Visit: Payer: 59 | Attending: Family Medicine | Admitting: Physical Therapy

## 2022-02-26 DIAGNOSIS — M25512 Pain in left shoulder: Secondary | ICD-10-CM | POA: Insufficient documentation

## 2022-02-26 DIAGNOSIS — M7502 Adhesive capsulitis of left shoulder: Secondary | ICD-10-CM | POA: Diagnosis not present

## 2022-02-26 DIAGNOSIS — M25612 Stiffness of left shoulder, not elsewhere classified: Secondary | ICD-10-CM | POA: Insufficient documentation

## 2022-02-26 DIAGNOSIS — M6281 Muscle weakness (generalized): Secondary | ICD-10-CM | POA: Insufficient documentation

## 2022-02-26 NOTE — Therapy (Signed)
OUTPATIENT PHYSICAL THERAPY SHOULDER EVALUATION   Patient Name: Jody Heath MRN: [id number] DOB:1971-01-18, 51 y.o., female Today's Date: 02/26/2022   PT End of Session - 02/26/22 1207     Visit Number 1    Number of Visits 12    Date for PT Re-Evaluation 04/09/22    Authorization Type [employer]    PT Start Time 1110    PT Stop Time 1150    PT Time Calculation (min) 40 min             Past Medical History:  Diagnosis Date   Anemia    [facility]reesia 01/19/2020   Hyperprolactinemia ([place]) 11/09/2019   Past Surgical History:  Procedure Laterality Date   ABDOMINAL HYSTERECTOMY     ADRENALECTOMY     APPENDECTOMY N/A    [facility]ia 01/19/2020   CESAREAN SECTION N/A    [facility]reesia 01/19/2020   Patient Active Problem List   Diagnosis Date Noted   Adhesive capsulitis of left shoulder 01/25/2022   Sacroiliac joint dysfunction of left side 08/27/2021   Strain of lumbar region 08/12/2021   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   Prolactin deficiency ([place]) 11/09/2019   Vitamin D deficiency 11/09/2019   Low back pain 11/09/2019   Facet arthropathy, multilevel 11/09/2019   Thoracic lordosis 11/09/2019   Hyperprolactinemia ([place]) 11/09/2019    PCP: Mackie Pai, PA-C  REFERRING PROVIDER: Rosemarie Ax, MD  REFERRING DIAG: M75.02 (ICD-10-CM) - Adhesive capsulitis of left shoulder  THERAPY DIAG:  Acute pain of left shoulder  Stiffness of left shoulder, not elsewhere classified  Muscle weakness (generalized)  Rationale for Evaluation and Treatment Rehabilitation  ONSET DATE: ~ 3 months  SUBJECTIVE:                                                                                                                                                                                      SUBJECTIVE STATEMENT: Patient accompanied by translator. Patient reports symptoms started about 3 months ago.  Diagnosed with adhesive capsulitis and given exercises by  doctor.  Pain has improved but not stiffness.   PERTINENT HISTORY: No significant MPH  PAIN:  Are you having pain? Yes: NPRS scale: 5/10 Pain location: L shoulder Pain description: steady ache in shoulder, occasionally radiates down to her elbow  Aggravating factors: lifting heavy objects, when I reach behind or above Relieving factors: rest  PRECAUTIONS: None  WEIGHT BEARING RESTRICTIONS No  FALLS:  Has patient fallen in last 6 months? 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  Active, goes to [facility], cardio and light resistance  PATIENT GOALS improve L shoulder movement, pain, be able to use L hand at gym  OBJECTIVE:   DIAGNOSTIC FINDINGS:  No imaging available  PATIENT SURVEYS:  Quick Dash 31.8% disability   COGNITION:  Overall cognitive status: Within functional limits for tasks assessed     SENSATION: WFL  POSTURE: Slightly forward head  UPPER EXTREMITY ROM:   Active ROM Right  AROM eval Left AROM eval Left Passive (Supine)  Shoulder flexion 167 130 120*  Shoulder extension 80 52   Shoulder abduction 170 105 90*  Shoulder adduction     Shoulder internal rotation T8/70 sacrum 55  Shoulder external rotation T3/90 occiput 30  (Blank rows = not tested)  UPPER EXTREMITY MMT:  MMT Right eval Left* eval  Shoulder flexion 5 4*  Shoulder extension    Shoulder abduction 5 4*  Shoulder adduction    Shoulder internal rotation 5 4*  Shoulder external rotation 5 4*  Elbow flexion 5 4*  Elbow extension 5 4*  Wrist flexion 5 4*  Wrist extension 5 4*  Grip strength (lbs) good good  (Blank rows = not tested)  *pain  SHOULDER SPECIAL TESTS:  Impingement tests: Painful arc test: positive    JOINT MOBILITY TESTING:   Decreased mobility L shoulder, guarding due to pain.   PALPATION:  Tenderness anterior L shoulder over biceps and RTC   TODAY'S TREATMENT:  Therapeutic Exercise: to improve strength and mobility.  Demo, verbal and tactile cues throughout for technique. - Shoulder Flexion Wall Slide with Towel  10 reps - Scaption Wall Slide with Towel  10 reps - Seated Shoulder Flexion AAROM with Pulley -demo - Seated Shoulder Scaption AAROM with Pulley at Side  demo - Isometric Shoulder Flexion at [place] - Isometric Shoulder Extension at [employer] - Standing Isometric Shoulder Internal Rotation at [employer] - Isometric Shoulder External Rotation at [employer] - Isometric Shoulder Abduction at [employer] - Seated Isometric Elbow Flexion  - 5 sec hold - Seated Isometric Elbow Extension   - 5 sec  hold   PATIENT EDUCATION: Education details: initial HEP Person educated: Patient Education method: Consulting civil engineer, Media planner, Verbal cues, and Handouts Education comprehension: verbalized understanding and returned demonstration   HOME EXERCISE PROGRAM: Access Code: [email or web address]   ASSESSMENT:  CLINICAL IMPRESSION: Patient is a 51 y.o. right hand dominant female who was seen today for physical therapy evaluation and treatment for L shoulder adhesive capsulitis.  Physical examination is consistent with referral, with Loreley [address] Joy demonstrating decreased L shoulder AROM and PROM, pain and decreased shoulder mobility all direction.  She reports difficulty with behind the back and overhead activities especially.  She would benefit from skilled physical therapy to decrease L shoulder pain and improve L shoulder ROM.    OBJECTIVE IMPAIRMENTS decreased mobility, decreased ROM, decreased strength, increased fascial restrictions, increased muscle spasms, impaired flexibility, impaired UE functional use, postural dysfunction, and pain.   ACTIVITY LIMITATIONS carrying, lifting, bending, sleeping,  bathing, dressing, reach over head, and hygiene/grooming  PARTICIPATION LIMITATIONS: cleaning and community activity  PERSONAL FACTORS  language barrier  are also affecting patient's functional outcome.   REHAB POTENTIAL: Excellent  CLINICAL DECISION MAKING: Stable/uncomplicated  EVALUATION COMPLEXITY: Low   GOALS: Goals reviewed with patient? Yes  SHORT TERM GOALS: Target date: 03/12/2022  Patient will be independent  with initial HEP.  Baseline: given Goal status: INITIAL   LONG TERM GOALS: Target date: 04/09/2022   Patient will be independent with advanced/ongoing HEP to improve outcomes and carryover.  Baseline: needs advancement Goal status: INITIAL  2.  Patient will report 75% improvement in left shoulder pain to improve QOL.  Baseline: 5/10 Goal status: INITIAL  3.  Patient to improve L shoulder AROM to WNL without pain provocation to allow for increased ease of ADLs.  Baseline: see objective Goal status: INITIAL  4.  Patient will demonstrate improved functional UE strength as demonstrated by 5/5 L shoulder strength. Baseline: 4/5 with increased pain Goal status: INITIAL  5  Patient will report <20% disability  on QuickDash to demonstrate improved functional ability.  Baseline: 31.8% disability  Goal status: INITIAL   PLAN: PT FREQUENCY: 1-2x/week  PT DURATION: 6 weeks  PLANNED INTERVENTIONS: Therapeutic exercises, Therapeutic activity, Neuromuscular re-education, Balance training, Gait training, Patient/Family education, Self Care, Joint mobilization, Dry Needling, Electrical stimulation, Cryotherapy, Moist heat, Taping, Ionotophoresis '4mg'$ /ml Dexamethasone, and Manual therapy  PLAN FOR NEXT SESSION: start with pulleys, review HEP, progress as tolerated, gentle PROM, modalities PRN   Rennie Natter, PT, DPT  02/26/2022, 12:21 PM

## 2022-03-02 ENCOUNTER — Encounter: Payer: Self-pay | Admitting: Physical Therapy

## 2022-03-02 ENCOUNTER — Ambulatory Visit: Payer: 59 | Admitting: Physical Therapy

## 2022-03-02 DIAGNOSIS — M25612 Stiffness of left shoulder, not elsewhere classified: Secondary | ICD-10-CM

## 2022-03-02 DIAGNOSIS — M25512 Pain in left shoulder: Secondary | ICD-10-CM | POA: Diagnosis not present

## 2022-03-02 DIAGNOSIS — M6281 Muscle weakness (generalized): Secondary | ICD-10-CM

## 2022-03-02 NOTE — Therapy (Signed)
OUTPATIENT PHYSICAL THERAPY Treatment Note   Patient Name: Jody Heath MRN: [id number] DOB:1970-11-12, 51 y.o., female Today's Date: 03/02/2022   PT End of Session - 03/02/22 1534     Visit Number 2    Number of Visits 12    Date for PT Re-Evaluation 04/09/22    Authorization Type [employer]    PT Start Time 1532    PT Stop Time 1616    PT Time Calculation (min) 44 min    Activity Tolerance Patient tolerated treatment well    Behavior During Therapy [facility] for tasks assessed/performed             Past Medical History:  Diagnosis Date   Anemia    Phreesia 01/19/2020   Hyperprolactinemia ([place]) 11/09/2019   Past Surgical History:  Procedure Laterality Date   ABDOMINAL HYSTERECTOMY     ADRENALECTOMY     APPENDECTOMY N/A    Phreesia 01/19/2020   CESAREAN SECTION N/A    Phreesia 01/19/2020   Patient Active Problem List   Diagnosis Date Noted   Adhesive capsulitis of left shoulder 01/25/2022   Sacroiliac joint dysfunction of left side 08/27/2021   Strain of lumbar region 08/12/2021   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[place]) 11/09/2019   Prolactin deficiency ([place]) 11/09/2019   Vitamin D deficiency 11/09/2019   Low back pain 11/09/2019   Facet arthropathy, multilevel 11/09/2019   Thoracic lordosis 11/09/2019   Hyperprolactinemia ([place]) 11/09/2019    PCP: Mackie Pai, PA-C  REFERRING PROVIDER: Rosemarie Ax, MD  REFERRING DIAG: M75.02 (ICD-10-CM) - Adhesive capsulitis of left shoulder  THERAPY DIAG:  Acute pain of left shoulder  Stiffness of left shoulder, not elsewhere classified  Muscle weakness (generalized)  Rationale for Evaluation and Treatment Rehabilitation  ONSET DATE: ~ 3 months  SUBJECTIVE:                                                                                                                                                                                      SUBJECTIVE STATEMENT: Patient's translator did not  arrive today, patient declined offer of video interpreter as she has very good comprehension, just difficulty speaking.  Patient reports a little pain in L shoulder, worried that it will remain tight.   PERTINENT HISTORY: No significant MPH  PAIN:  Are you having pain? Yes: NPRS scale: 5/10 Pain location: L shoulder Pain description: steady ache in shoulder, occasionally radiates down to her elbow  Aggravating factors: lifting heavy objects, when I reach behind or above Relieving factors: rest  PRECAUTIONS: None  WEIGHT BEARING RESTRICTIONS No  FALLS:  Has patient fallen in last 6 months? 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  Active, goes to [facility], cardio and light resistance  PATIENT GOALS improve L shoulder movement, pain, be able to use L hand at gym  OBJECTIVE:   DIAGNOSTIC FINDINGS:  No imaging available  PATIENT SURVEYS:  Quick Dash 31.8% disability   COGNITION:  Overall cognitive status: Within functional limits for tasks assessed     SENSATION: WFL  POSTURE: Slightly forward head  UPPER EXTREMITY ROM:   Active ROM Right  AROM eval Left AROM eval Left Passive (Supine)  Shoulder flexion 167 130 120*  Shoulder extension 80 52   Shoulder abduction 170 105 90*  Shoulder adduction     Shoulder internal rotation T8/70 sacrum 55  Shoulder external rotation T3/90 occiput 30  (Blank rows = not tested)  UPPER EXTREMITY MMT:  MMT Right eval Left* eval  Shoulder flexion 5 4*  Shoulder extension    Shoulder abduction 5 4*  Shoulder adduction    Shoulder internal rotation 5 4*  Shoulder external rotation 5 4*  Elbow flexion 5 4*  Elbow extension 5 4*  Wrist flexion 5 4*  Wrist extension 5 4*  Grip strength (lbs)  good good  (Blank rows = not tested)  *pain  SHOULDER SPECIAL TESTS:  Impingement tests: Painful arc test: positive    JOINT MOBILITY TESTING:  Decreased mobility L shoulder, guarding due to pain.   PALPATION:  Tenderness anterior L shoulder over biceps and RTC   TODAY'S TREATMENT:  03/02/2022 Therapeutic Exercise: to improve strength and mobility.  Demo, verbal and tactile cues throughout for technique. Pulleys flexion x 3 min, scaption x 3 min  Finger ladder abduction x 10  Isometric shoulder flexion 5 x 5 sec hold, isometric shoulder extension 5 x 5 sec hold, isometric shoulder ER 5 x 5 sec hold, isometric shoulder ER 5 x 5 sec hold.  Manual Therapy: to decrease muscle spasm and pain and improve mobility IASTM to L pec insertion, L biceps, L extensor group, STM/TPR posterior shoulder, cross friction L biceps tendon, mobs to L shoulder posterior and inferior, PROM into flexion, abduction, IR and ER as tolerated.    02/26/2022 Therapeutic Exercise: to improve strength and mobility.  Demo, verbal and tactile cues throughout for technique. - Shoulder Flexion Wall Slide with Towel  10 reps - Scaption Wall Slide with Towel  10 reps - Seated Shoulder Flexion AAROM with Pulley -demo - Seated Shoulder Scaption AAROM with Pulley at Side  demo - Isometric Shoulder Flexion at [place] - Isometric Shoulder Extension at [employer] - Standing Isometric Shoulder Internal Rotation at [employer] - Isometric Shoulder External Rotation at [employer] - Isometric Shoulder Abduction at [employer] - Seated Isometric Elbow Flexion  - 5 sec hold - Seated Isometric Elbow Extension   - 5 sec  hold   PATIENT EDUCATION: Education details: initial HEP Person educated: Patient Education method: Consulting civil engineer, Media planner, Verbal cues, and Handouts Education comprehension: verbalized understanding and returned demonstration   HOME EXERCISE PROGRAM: Access Code: [email or web address]   ASSESSMENT:  CLINICAL  IMPRESSION: Jody Heath reports good compliance with HEP and no changes since IE.  Reviewed HEP, she did report tightness in L extensor group with isometric shoulder flexion "like needles" so educated to decrease  contraction.  She felt the pulleys very beneficial so assisted with where to purchase today.  Focused on remaining session on manual therapy to both elbow to decrease pain in lateral epicondyle and shoulder to improve ROM.  Tolerated well and reported decreased tightness with flexion and improvement with external rotation.  Jody Heath continues to demonstrate potential for improvement and would benefit from continued skilled therapy to address impairments.      OBJECTIVE IMPAIRMENTS decreased mobility, decreased ROM, decreased strength, increased fascial restrictions, increased muscle spasms, impaired flexibility, impaired UE functional use, postural dysfunction, and pain.   ACTIVITY LIMITATIONS carrying, lifting, bending, sleeping, bathing, dressing, reach over head, and hygiene/grooming  PARTICIPATION LIMITATIONS: cleaning and community activity  PERSONAL FACTORS  language barrier  are also affecting patient's functional outcome.   REHAB POTENTIAL: Excellent  CLINICAL DECISION MAKING: Stable/uncomplicated  EVALUATION COMPLEXITY: Low   GOALS: Goals reviewed with patient? Yes  SHORT TERM GOALS: Target date: 03/12/2022  Patient will be independent with initial HEP.  Baseline: given Goal status: IN PROGRESS   LONG TERM GOALS: Target date: 04/09/2022   Patient will be independent with advanced/ongoing HEP to improve outcomes and carryover.  Baseline: needs advancement Goal status: IN PROGRESS  2.  Patient will report 75% improvement in left shoulder pain to improve QOL.  Baseline: 5/10 Goal status: IN PROGRESS  3.  Patient to improve L shoulder AROM to WNL without pain provocation to allow for increased ease of ADLs.  Baseline: see objective Goal status:  IN PROGRESS  4.  Patient will demonstrate improved functional UE strength as demonstrated by 5/5 L shoulder strength. Baseline: 4/5 with increased pain Goal status: IN PROGRESS  5  Patient will report <20% disability  on QuickDash to demonstrate improved functional ability.  Baseline: 31.8% disability  Goal status: IN PROGRESS   PLAN: PT FREQUENCY: 1-2x/week  PT DURATION: 6 weeks  PLANNED INTERVENTIONS: Therapeutic exercises, Therapeutic activity, Neuromuscular re-education, Balance training, Gait training, Patient/Family education, Self Care, Joint mobilization, Dry Needling, Electrical stimulation, Cryotherapy, Moist heat, Taping, Ionotophoresis '4mg'$ /ml Dexamethasone, and Manual therapy  PLAN FOR NEXT SESSION: progress ROM as tolerated, modalities PRN  Rennie Natter, PT, DPT  03/02/2022, 6:14 PM

## 2022-03-08 ENCOUNTER — Encounter: Payer: Self-pay | Admitting: Physical Therapy

## 2022-03-08 ENCOUNTER — Ambulatory Visit: Payer: 59 | Admitting: Physical Therapy

## 2022-03-08 DIAGNOSIS — M25512 Pain in left shoulder: Secondary | ICD-10-CM

## 2022-03-08 DIAGNOSIS — M6281 Muscle weakness (generalized): Secondary | ICD-10-CM

## 2022-03-08 DIAGNOSIS — M25612 Stiffness of left shoulder, not elsewhere classified: Secondary | ICD-10-CM

## 2022-03-08 NOTE — Therapy (Signed)
OUTPATIENT PHYSICAL THERAPY Treatment Note   Patient Name: Jody Heath MRN: [id number] DOB:10-26-1970, 51 y.o., female Today's Date: 03/08/2022   PT End of Session - 03/08/22 1023     Visit Number 3    Number of Visits 12    Date for PT Re-Evaluation 04/09/22    Authorization Type [employer]    PT Start Time 1020    PT Stop Time 1059    PT Time Calculation (min) 39 min    Activity Tolerance Patient tolerated treatment well    Behavior During Therapy [facility] for tasks assessed/performed             Past Medical History:  Diagnosis Date   Anemia    Phreesia 01/19/2020   Hyperprolactinemia ([place]) 11/09/2019   Past Surgical History:  Procedure Laterality Date   ABDOMINAL HYSTERECTOMY     ADRENALECTOMY     APPENDECTOMY N/A    Phreesia 01/19/2020   CESAREAN SECTION N/A    Phreesia 01/19/2020   Patient Active Problem List   Diagnosis Date Noted   Adhesive capsulitis of left shoulder 01/25/2022   Sacroiliac joint dysfunction of left side 08/27/2021   Strain of lumbar region 08/12/2021   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[place]) 11/09/2019   Prolactin deficiency ([place]) 11/09/2019   Vitamin D deficiency 11/09/2019   Low back pain 11/09/2019   Facet arthropathy, multilevel 11/09/2019   Thoracic lordosis 11/09/2019   Hyperprolactinemia ([place]) 11/09/2019    PCP: Mackie Pai, PA-C  REFERRING PROVIDER: Rosemarie Ax, MD  REFERRING DIAG: M75.02 (ICD-10-CM) - Adhesive capsulitis of left shoulder  THERAPY DIAG:  Acute pain of left shoulder  Stiffness of left shoulder, not elsewhere classified  Muscle weakness (generalized)  Rationale for Evaluation and Treatment Rehabilitation  ONSET DATE: ~ 3 months  SUBJECTIVE:                                                                                                                                                                                      SUBJECTIVE STATEMENT: Pt. Reports she is using shoulder  pulleys at home daily.  Still has a little shoulder pain.  Reported soreness in L pec following IASTM last session for 2-3 days.   PERTINENT HISTORY: No significant PMH  PAIN:  Are you having pain? Yes: NPRS scale: 5/10 Pain location: L shoulder Pain description: steady ache in shoulder, occasionally radiates down to her elbow  Aggravating factors: lifting heavy objects, when I reach behind or above Relieving factors: rest  PRECAUTIONS: None  WEIGHT BEARING RESTRICTIONS No  FALLS:  Has patient fallen in last 6 months? 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  Active, goes to [facility], cardio and light resistance  PATIENT GOALS improve L shoulder movement, pain, be able to use L hand at gym  OBJECTIVE:   DIAGNOSTIC FINDINGS:  No imaging available  PATIENT SURVEYS:  Quick Dash 31.8% disability   COGNITION:  Overall cognitive status: Within functional limits for tasks assessed     SENSATION: WFL  POSTURE: Slightly forward head  UPPER EXTREMITY ROM:   Active ROM Right  AROM eval Left AROM eval Left Passive (Supine)  Shoulder flexion 167 130 120*  Shoulder extension 80 52   Shoulder abduction 170 105 90*  Shoulder adduction     Shoulder internal rotation T8/70 sacrum 55  Shoulder external rotation T3/90 occiput 30  (Blank rows = not tested)  UPPER EXTREMITY MMT:  MMT Right eval Left* eval  Shoulder flexion 5 4*  Shoulder extension    Shoulder abduction 5 4*  Shoulder adduction    Shoulder internal rotation 5 4*  Shoulder external rotation 5 4*  Elbow flexion 5 4*  Elbow extension 5 4*  Wrist flexion 5 4*  Wrist extension 5 4*  Grip strength (lbs) good good  (Blank rows = not tested)  *pain  SHOULDER SPECIAL  TESTS:  Impingement tests: Painful arc test: positive    JOINT MOBILITY TESTING:  Decreased mobility L shoulder, guarding due to pain.   PALPATION:  Tenderness anterior L shoulder over biceps and RTC   TODAY'S TREATMENT:  03/08/2022 Therapeutic Exercise: to improve strength and mobility.  Demo, verbal and tactile cues throughout for technique. Pulleys flexion x 3 min, scaption x 3 min  Finger ladder abduction x 10 L AAROM abduction with cane x 10 L AAROM L shoulder ER with cane x 10  In supine AAROM bil shoulder flexion with cane x 10 Manual Therapy: to decrease muscle spasm and pain and improve mobility STM to L pec insertion,IASTM L biceps, L extensor group, STM/TPR posterior shoulder, mobs to L shoulder posterior and inferior, PROM into flexion, abduction, IR and ER as tolerated.   03/02/2022 Therapeutic Exercise: to improve strength and mobility.  Demo, verbal and tactile cues throughout for technique. Pulleys flexion x 3 min, scaption x 3 min  Finger ladder abduction x 10  Isometric shoulder flexion 5 x 5 sec hold, isometric shoulder extension 5 x 5 sec hold, isometric shoulder ER 5 x 5 sec hold, isometric shoulder ER 5 x 5 sec hold.  Manual Therapy: to decrease muscle spasm and pain and improve mobility IASTM to L pec insertion, L biceps, L extensor group, STM/TPR posterior shoulder, cross friction L biceps tendon, mobs to L shoulder posterior and inferior, PROM into flexion, abduction, IR and ER as tolerated.    02/26/2022 Therapeutic Exercise: to improve strength and mobility.  Demo, verbal and tactile cues throughout for technique. - Shoulder Flexion Wall Slide with Towel  10 reps - Scaption Wall Slide with Towel  10 reps - Seated Shoulder Flexion AAROM with Pulley -demo - Seated Shoulder Scaption AAROM with Pulley at Side  demo - Isometric Shoulder Flexion at [place] - Isometric Shoulder Extension at [employer] - Standing Isometric Shoulder Internal Rotation at [employer] - Tour manager at [employer] - Isometric Shoulder Abduction at [employer] -  Seated Isometric Elbow Flexion  - 5 sec hold - Seated Isometric Elbow Extension   - 5 sec  hold   PATIENT EDUCATION: Education details: continue HEP as tolerated, thumbs up with abduction. Person educated: Patient Education method: Explanation Education comprehension: verbalized understanding and returned demonstration   HOME EXERCISE PROGRAM: Access Code: [email or web address]   ASSESSMENT:  CLINICAL IMPRESSION: Jody Heath reported soreness after IASTM to pectoralis, so decreased manual therapy to the area, focusing on shoulder mobs, after which demonstrated improved ER in supine, still mostly limited with abduction but noting improving AAROM into abduction.   She reported less tightness at end of session.  Jody Heath continues to demonstrate potential for improvement and would benefit from continued skilled therapy to address impairments.     OBJECTIVE IMPAIRMENTS decreased mobility, decreased ROM, decreased strength, increased fascial restrictions, increased muscle spasms, impaired flexibility, impaired UE functional use, postural dysfunction, and pain.   ACTIVITY LIMITATIONS carrying, lifting, bending, sleeping, bathing, dressing, reach over head, and hygiene/grooming  PARTICIPATION LIMITATIONS: cleaning and community activity  PERSONAL FACTORS  language barrier  are also affecting patient's functional outcome.   REHAB POTENTIAL: Excellent  CLINICAL DECISION MAKING: Stable/uncomplicated  EVALUATION COMPLEXITY: Low   GOALS: Goals reviewed with patient? Yes  SHORT TERM GOALS: Target date: 03/12/2022  Patient will be independent with initial HEP.  Baseline: given Goal status: IN PROGRESS   LONG TERM GOALS: Target date: 04/09/2022   Patient will be independent with advanced/ongoing HEP to improve outcomes and carryover.  Baseline: needs advancement Goal  status: IN PROGRESS  2.  Patient will report 75% improvement in left shoulder pain to improve QOL.  Baseline: 5/10 Goal status: IN PROGRESS  3.  Patient to improve L shoulder AROM to WNL without pain provocation to allow for increased ease of ADLs.  Baseline: see objective Goal status: IN PROGRESS  4.  Patient will demonstrate improved functional UE strength as demonstrated by 5/5 L shoulder strength. Baseline: 4/5 with increased pain Goal status: IN PROGRESS  5  Patient will report <20% disability  on QuickDash to demonstrate improved functional ability.  Baseline: 31.8% disability  Goal status: IN PROGRESS   PLAN: PT FREQUENCY: 1-2x/week  PT DURATION: 6 weeks  PLANNED INTERVENTIONS: Therapeutic exercises, Therapeutic activity, Neuromuscular re-education, Balance training, Gait training, Patient/Family education, Self Care, Joint mobilization, Dry Needling, Electrical stimulation, Cryotherapy, Moist heat, Taping, Ionotophoresis '4mg'$ /ml Dexamethasone, and Manual therapy  PLAN FOR NEXT SESSION: progress ROM as tolerated, modalities PRN  Rennie Natter, PT, DPT  03/08/2022, 11:30 AM

## 2022-03-12 ENCOUNTER — Encounter: Payer: 59 | Admitting: Physical Therapy

## 2022-03-15 ENCOUNTER — Encounter: Payer: 59 | Admitting: Physical Therapy

## 2022-03-16 ENCOUNTER — Ambulatory Visit: Payer: 59 | Admitting: Cardiology

## 2022-03-19 ENCOUNTER — Ambulatory Visit: Payer: 59 | Attending: Family Medicine

## 2022-03-19 DIAGNOSIS — M25512 Pain in left shoulder: Secondary | ICD-10-CM | POA: Insufficient documentation

## 2022-03-19 DIAGNOSIS — M25612 Stiffness of left shoulder, not elsewhere classified: Secondary | ICD-10-CM | POA: Diagnosis present

## 2022-03-19 DIAGNOSIS — M6281 Muscle weakness (generalized): Secondary | ICD-10-CM | POA: Insufficient documentation

## 2022-03-19 NOTE — Therapy (Addendum)
OUTPATIENT PHYSICAL THERAPY Treatment Note   Patient Name: Jody Heath MRN: [id number] DOB:07-19-1971, 51 y.o., female Today's Date: 03/19/2022   PT End of Session - 03/19/22 1113     Visit Number 4    Number of Visits 12    Date for PT Re-Evaluation 04/09/22    Authorization Type [employer]    PT Start Time 1019    PT Stop Time 1100    PT Time Calculation (min) 41 min    Activity Tolerance Patient tolerated treatment well    Behavior During Therapy [facility] for tasks assessed/performed              Past Medical History:  Diagnosis Date   Anemia    [employer] 01/19/2020   Hyperprolactinemia ([place]) 11/09/2019   Past Surgical History:  Procedure Laterality Date   ABDOMINAL HYSTERECTOMY     ADRENALECTOMY     APPENDECTOMY N/A    [employer]reesia 01/19/2020   CESAREAN SECTION N/A    [employer]reesia 01/19/2020   Patient Active Problem List   Diagnosis Date Noted   Adhesive capsulitis of left shoulder 01/25/2022   Sacroiliac joint dysfunction of left side 08/27/2021   Strain of lumbar region 08/12/2021   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[place]) 11/09/2019   Prolactin deficiency ([place]) 11/09/2019   Vitamin D deficiency 11/09/2019   Low back pain 11/09/2019   Facet arthropathy, multilevel 11/09/2019   Thoracic lordosis 11/09/2019   Hyperprolactinemia ([place]) 11/09/2019    PCP: Mackie Pai, PA-C  REFERRING PROVIDER: Rosemarie Ax, MD  REFERRING DIAG: M75.02 (ICD-10-CM) - Adhesive capsulitis of left shoulder  THERAPY DIAG:  Acute pain of left shoulder  Stiffness of left shoulder, not elsewhere classified  Muscle weakness (generalized)  Rationale for Evaluation and Treatment Rehabilitation  ONSET DATE: ~ 3 months  SUBJECTIVE:                                                                                                                                                                                      SUBJECTIVE STATEMENT: A litle achy in L  shoulder.  PERTINENT HISTORY: No significant PMH  PAIN:  Are you having pain? Yes: NPRS scale: 5/10 Pain location: L shoulder Pain description: steady ache in shoulder, occasionally radiates down to her elbow  Aggravating factors: lifting heavy objects, when I reach behind or above Relieving factors: rest  PRECAUTIONS: None  WEIGHT BEARING RESTRICTIONS No  FALLS:  Has patient fallen in last 6 months? 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  Active, goes to [employer] [facility] [employer] [facility] [employer] [facility] [employer], cardio and light resistance  PATIENT GOALS improve L shoulder movement, pain, be able to use L hand at gym  OBJECTIVE:   DIAGNOSTIC FINDINGS:  No imaging available  PATIENT SURVEYS:  Quick Dash 31.8% disability   COGNITION:  Overall cognitive status: Within functional limits for tasks assessed     SENSATION: WFL  POSTURE: Slightly forward head  UPPER EXTREMITY ROM:   Active ROM Right  AROM eval Left AROM eval Left Passive (Supine) Left AROM 03/19/22  Shoulder flexion 167 130 120* 138  Shoulder extension 80 52    Shoulder abduction 170 105 90* 140  Shoulder adduction      Shoulder internal rotation T8/70 sacrum 55   Shoulder external rotation T3/90 occiput 30   (Blank rows = not tested)  UPPER EXTREMITY MMT:  MMT Right eval Left* eval  Shoulder flexion 5 4*  Shoulder extension    Shoulder abduction 5 4*  Shoulder adduction    Shoulder internal rotation 5 4*  Shoulder external rotation 5 4*  Elbow flexion 5 4*  Elbow extension 5 4*  Wrist flexion 5 4*  Wrist extension 5 4*  Grip strength (lbs) good good  (Blank rows = not tested)  *pain  SHOULDER SPECIAL TESTS:  Impingement tests: Painful arc test: positive    JOINT MOBILITY TESTING:  Decreased  mobility L shoulder, guarding due to pain.   PALPATION:  Tenderness anterior L shoulder over biceps and RTC   TODAY'S TREATMENT:  03/19/22 Therapeutic Exercise: Pulleys - 3 min flex/ 3 min scap L shoulder abduction + flex finger ladder x 5 - only able to do 5 reps d/t fatigue Supine chest press w/ cane x 10 L shoulder flexion AROM supine 2x10 L shld protraction x 10 supine L shld abduction S/L x10  Manual Therapy: STM to L bicep, ant deltoid, infrapsinatus  03/08/2022 Therapeutic Exercise: to improve strength and mobility.  Demo, verbal and tactile cues throughout for technique. Pulleys flexion x 3 min, scaption x 3 min  Finger ladder abduction x 10 L AAROM abduction with cane x 10 L AAROM L shoulder ER with cane x 10  In supine AAROM bil shoulder flexion with cane x 10 Manual Therapy: to decrease muscle spasm and pain and improve mobility STM to L pec insertion,IASTM L biceps, L extensor group, STM/TPR posterior shoulder, mobs to L shoulder posterior and inferior, PROM into flexion, abduction, IR and ER as tolerated.   03/02/2022 Therapeutic Exercise: to improve strength and mobility.  Demo, verbal and tactile cues throughout for technique. Pulleys flexion x 3 min, scaption x 3 min  Finger ladder abduction x 10  Isometric shoulder flexion 5 x 5 sec hold, isometric shoulder extension 5 x 5 sec hold, isometric shoulder ER 5 x 5 sec hold, isometric shoulder ER 5 x 5 sec hold.  Manual Therapy: to decrease muscle spasm and pain and improve mobility IASTM to L pec insertion, L biceps, L extensor group, STM/TPR posterior shoulder, cross friction L biceps tendon, mobs to L shoulder posterior and inferior, PROM into flexion, abduction, IR and ER as tolerated.      PATIENT EDUCATION: Education details: continue HEP as tolerated, thumbs up with abduction. Person educated: Patient Education method: Explanation Education comprehension: verbalized understanding and returned  demonstration   HOME EXERCISE PROGRAM: Access Code: [email or web address]   ASSESSMENT:  CLINICAL IMPRESSION: Pt demonstrates fatigue in L shoulder when raising into  abd in standing. Continued to progress AROM and initiated strengthening for muscle facilitation. Advanced pt through exercises monitoring pain levels and readiness to progress. She demonstrates improved OH ROM. Pt completed session w/o increased pain and good tolerance for exercises.   OBJECTIVE IMPAIRMENTS decreased mobility, decreased ROM, decreased strength, increased fascial restrictions, increased muscle spasms, impaired flexibility, impaired UE functional use, postural dysfunction, and pain.   ACTIVITY LIMITATIONS carrying, lifting, bending, sleeping, bathing, dressing, reach over head, and hygiene/grooming  PARTICIPATION LIMITATIONS: cleaning and community activity  PERSONAL FACTORS  language barrier  are also affecting patient's functional outcome.   REHAB POTENTIAL: Excellent  CLINICAL DECISION MAKING: Stable/uncomplicated  EVALUATION COMPLEXITY: Low   GOALS: Goals reviewed with patient? Yes  SHORT TERM GOALS: Target date: 03/12/2022  Patient will be independent with initial HEP.  Baseline: given Goal status: MET - 03/19/22   LONG TERM GOALS: Target date: 04/09/2022   Patient will be independent with advanced/ongoing HEP to improve outcomes and carryover.  Baseline: needs advancement Goal status: IN PROGRESS  2.  Patient will report 75% improvement in left shoulder pain to improve QOL.  Baseline: 5/10 Goal status: IN PROGRESS  3.  Patient to improve L shoulder AROM to WNL without pain provocation to allow for increased ease of ADLs.  Baseline: see objective Goal status: IN PROGRESS  4.  Patient will demonstrate improved functional UE strength as demonstrated by 5/5 L shoulder strength. Baseline: 4/5 with increased pain Goal status: IN PROGRESS  5  Patient will report <20% disability  on QuickDash to  demonstrate improved functional ability.  Baseline: 31.8% disability  Goal status: IN PROGRESS   PLAN: PT FREQUENCY: 1-2x/week  PT DURATION: 6 weeks  PLANNED INTERVENTIONS: Therapeutic exercises, Therapeutic activity, Neuromuscular re-education, Balance training, Gait training, Patient/Family education, Self Care, Joint mobilization, Dry Needling, Electrical stimulation, Cryotherapy, Moist heat, Taping, Ionotophoresis 67m/ml Dexamethasone, and Manual therapy  PLAN FOR NEXT SESSION: progress ROM as tolerated, modalities PRN  BArtist Pais PTA 03/19/2022, 11:13 AM

## 2022-03-22 ENCOUNTER — Ambulatory Visit: Payer: 59 | Admitting: Physical Therapy

## 2022-03-22 DIAGNOSIS — M25612 Stiffness of left shoulder, not elsewhere classified: Secondary | ICD-10-CM

## 2022-03-22 DIAGNOSIS — M25512 Pain in left shoulder: Secondary | ICD-10-CM

## 2022-03-22 DIAGNOSIS — M6281 Muscle weakness (generalized): Secondary | ICD-10-CM

## 2022-03-22 NOTE — Therapy (Signed)
OUTPATIENT PHYSICAL THERAPY Treatment Note   Patient Name: Jody Heath MRN: [id number] DOB:[birth year], 51 y.o., female Today's Date: 03/22/2022   PT End of Session - 03/22/22 1036     Visit Number 5    Number of Visits 12    Date for PT Re-Evaluation 04/09/22    Authorization Type [employer]    PT Start Time 1033    PT Stop Time 1102    PT Time Calculation (min) 29 min    Activity Tolerance Patient tolerated treatment well    Behavior During Therapy [facility] for tasks assessed/performed              Past Medical History:  Diagnosis Date   Anemia    Phreesia 01/19/2020   Hyperprolactinemia ([place]) 11/09/2019   Past Surgical History:  Procedure Laterality Date   ABDOMINAL HYSTERECTOMY     ADRENALECTOMY     APPENDECTOMY N/A    Phreesia 01/19/2020   CESAREAN SECTION N/A    Phreesia 01/19/2020   Patient Active Problem List   Diagnosis Date Noted   Adhesive capsulitis of left shoulder 01/25/2022   Sacroiliac joint dysfunction of left side 08/27/2021   Strain of lumbar region 08/12/2021   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[place]) 11/09/2019   Prolactin deficiency ([place]) 11/09/2019   Vitamin D deficiency 11/09/2019   Low back pain 11/09/2019   Facet arthropathy, multilevel 11/09/2019   Thoracic lordosis 11/09/2019   Hyperprolactinemia ([place]) 11/09/2019    PCP: Mackie Pai, PA-C  REFERRING PROVIDER: Rosemarie Ax, MD  REFERRING DIAG: M75.02 (ICD-10-CM) - Adhesive capsulitis of left shoulder  THERAPY DIAG:  Acute pain of left shoulder  Stiffness of left shoulder, not elsewhere classified  Muscle weakness (generalized)  Rationale for Evaluation and Treatment Rehabilitation  ONSET DATE: ~ 3 months  SUBJECTIVE:                                                                                                                                                                                      SUBJECTIVE STATEMENT: Patient arrived over 15 min late.   Translator also arrived today.  Just a little pain in L shoulder.  PERTINENT HISTORY: No significant PMH  PAIN:  Are you having pain? Yes: NPRS scale: 3/10 Pain location: L shoulder Pain description: steady ache in shoulder, occasionally radiates down to her elbow  Aggravating factors: lifting heavy objects, when I reach behind or above Relieving factors: rest  PRECAUTIONS: None  WEIGHT BEARING RESTRICTIONS No  FALLS:  Has patient fallen in last 6 months? 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  Active, goes to [employer], cardio and light resistance  PATIENT GOALS improve L shoulder movement, pain, be able to use L hand at gym  OBJECTIVE:   DIAGNOSTIC FINDINGS:  No imaging available  PATIENT SURVEYS:  Quick Dash 31.8% disability   COGNITION:  Overall cognitive status: Within functional limits for tasks assessed     SENSATION: WFL  POSTURE: Slightly forward head  UPPER EXTREMITY ROM:   Active ROM Right  AROM eval Left AROM eval Left Passive (Supine) Left AROM 03/19/22  Shoulder flexion 167 130 120* 138  Shoulder extension 80 52    Shoulder abduction 170 105 90* 140  Shoulder adduction      Shoulder internal rotation T8/70 sacrum 55   Shoulder external rotation T3/90 occiput 30   (Blank rows = not tested)  UPPER EXTREMITY MMT:  MMT Right eval Left* eval  Shoulder flexion 5 4*  Shoulder extension    Shoulder abduction 5 4*  Shoulder adduction    Shoulder internal rotation 5 4*  Shoulder external rotation 5 4*  Elbow flexion 5 4*  Elbow extension 5 4*  Wrist flexion 5 4*  Wrist extension 5 4*  Grip strength (lbs) good good  (Blank rows = not tested)  *pain  SHOULDER SPECIAL TESTS:  Impingement tests: Painful  arc test: positive    JOINT MOBILITY TESTING:  Decreased mobility L shoulder, guarding due to pain.   PALPATION:  Tenderness anterior L shoulder over biceps and RTC   TODAY'S TREATMENT:  03/22/2022 Therapeutic Exercise: to improve strength and mobility.  Demo, verbal and tactile cues throughout for technique. Pulleys x 2 min flexion, x 2 min scaption Finger ladder flexion x 5, abduction x 5  Prayer exercise x 20 Shoulder rows RTB x 20 Shoulder extension RTB x 20 Manual Therapy: to decrease muscle spasm and pain and improve mobility.   Mobs to L shoulder posterior and inferior, PROM intoIR and ER as tolerated.  03/19/22 Therapeutic Exercise: Pulleys - 3 min flex/ 3 min scap L shoulder abduction + flex finger ladder x 5 - only able to do 5 reps d/t fatigue Supine chest press w/ cane x 10 L shoulder flexion AROM supine 2x10 L shld protraction x 10 supine L shld abduction S/L x10  Manual Therapy: STM to L bicep, ant deltoid, infrapsinatus  03/08/2022 Therapeutic Exercise: to improve strength and mobility.  Demo, verbal and tactile cues throughout for technique. Pulleys flexion x 3 min, scaption x 3 min  Finger ladder abduction x 10 L AAROM abduction with cane x 10 L AAROM L shoulder ER with cane x 10  In supine AAROM bil shoulder flexion with cane x 10 Manual Therapy: to decrease muscle spasm and pain and improve mobility STM to L pec insertion,IASTM L biceps, L extensor group, STM/TPR posterior shoulder, mobs to L shoulder posterior and inferior, PROM into flexion, abduction, IR and ER as tolerated.     PATIENT EDUCATION: Education details: progressed HEP, information on dry needling as well Person educated: Patient Education method: Consulting civil engineer, Demonstration, Verbal cues, and Handouts Education comprehension: verbalized understanding and returned demonstration   HOME EXERCISE PROGRAM: Access Code: [email or web address]   ASSESSMENT:  CLINICAL IMPRESSION: Jody Heath  demonstrates improvement in flexion and abduction with finger letter, but still has increased L shoulder pain with abduction.  Today progressed exercises added theraband exercises which  she tolerated well.  Also discussed possible use of dry needling to decrease tightness in L shoulder to improve ROM, may try next session.  Jody Heath continues to demonstrate potential for improvement and would benefit from continued skilled therapy to address impairments.      OBJECTIVE IMPAIRMENTS decreased mobility, decreased ROM, decreased strength, increased fascial restrictions, increased muscle spasms, impaired flexibility, impaired UE functional use, postural dysfunction, and pain.   ACTIVITY LIMITATIONS carrying, lifting, bending, sleeping, bathing, dressing, reach over head, and hygiene/grooming  PARTICIPATION LIMITATIONS: cleaning and community activity  PERSONAL FACTORS  language barrier  are also affecting patient's functional outcome.   REHAB POTENTIAL: Excellent  CLINICAL DECISION MAKING: Stable/uncomplicated  EVALUATION COMPLEXITY: Low   GOALS: Goals reviewed with patient? Yes  SHORT TERM GOALS: Target date: 03/12/2022  Patient will be independent with initial HEP.  Baseline: given Goal status: MET - 03/19/22   LONG TERM GOALS: Target date: 04/09/2022   Patient will be independent with advanced/ongoing HEP to improve outcomes and carryover.  Baseline: needs advancement Goal status: IN PROGRESS  2.  Patient will report 75% improvement in left shoulder pain to improve QOL.  Baseline: 5/10 Goal status: IN PROGRESS  3.  Patient to improve L shoulder AROM to WNL without pain provocation to allow for increased ease of ADLs.  Baseline: see objective Goal status: IN PROGRESS  4.  Patient will demonstrate improved functional UE strength as demonstrated by 5/5 L shoulder strength. Baseline: 4/5 with increased pain Goal status: IN PROGRESS  5  Patient will report <20%  disability  on QuickDash to demonstrate improved functional ability.  Baseline: 31.8% disability  Goal status: IN PROGRESS   PLAN: PT FREQUENCY: 1-2x/week  PT DURATION: 6 weeks  PLANNED INTERVENTIONS: Therapeutic exercises, Therapeutic activity, Neuromuscular re-education, Balance training, Gait training, Patient/Family education, Self Care, Joint mobilization, Dry Needling, Electrical stimulation, Cryotherapy, Moist heat, Taping, Ionotophoresis 37m/ml Dexamethasone, and Manual therapy  PLAN FOR NEXT SESSION: progress ROM as tolerated, modalities PRN, consider DN to left pectoralis and subscap.   ERennie Natter PT, DPT  03/22/2022, 12:02 PM

## 2022-03-26 ENCOUNTER — Ambulatory Visit: Payer: 59 | Admitting: Physical Therapy

## 2022-03-26 ENCOUNTER — Encounter: Payer: Self-pay | Admitting: Physical Therapy

## 2022-03-26 DIAGNOSIS — M6281 Muscle weakness (generalized): Secondary | ICD-10-CM

## 2022-03-26 DIAGNOSIS — M25512 Pain in left shoulder: Secondary | ICD-10-CM

## 2022-03-26 DIAGNOSIS — M25612 Stiffness of left shoulder, not elsewhere classified: Secondary | ICD-10-CM

## 2022-03-26 NOTE — Therapy (Signed)
OUTPATIENT PHYSICAL THERAPY Treatment Note   Patient Name: Jody Heath MRN: [id number] DOB:May 13, 1971, 51 y.o., female Today's Date: 03/26/2022   PT End of Session - 03/26/22 1021     Visit Number 6    Number of Visits 12    Date for PT Re-Evaluation 04/09/22    Authorization Type [employer]    PT Start Time 1018    PT Stop Time 1101    PT Time Calculation (min) 43 min    Activity Tolerance Patient tolerated treatment well    Behavior During Therapy [facility] for tasks assessed/performed              Past Medical History:  Diagnosis Date   Anemia    Phreesia 01/19/2020   Hyperprolactinemia ([place]) 11/09/2019   Past Surgical History:  Procedure Laterality Date   ABDOMINAL HYSTERECTOMY     ADRENALECTOMY     APPENDECTOMY N/A    Phreesia 01/19/2020   CESAREAN SECTION N/A    Phreesia 01/19/2020   Patient Active Problem List   Diagnosis Date Noted   Adhesive capsulitis of left shoulder 01/25/2022   Sacroiliac joint dysfunction of left side 08/27/2021   Strain of lumbar region 08/12/2021   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[name]) 11/09/2019   Prolactin deficiency ([name]) 11/09/2019   Vitamin D deficiency 11/09/2019   Low back pain 11/09/2019   Facet arthropathy, multilevel 11/09/2019   Thoracic lordosis 11/09/2019   Hyperprolactinemia ([place]) 11/09/2019    PCP: Mackie Pai, PA-C  REFERRING PROVIDER: Rosemarie Ax, MD  REFERRING DIAG: M75.02 (ICD-10-CM) - Adhesive capsulitis of left shoulder  THERAPY DIAG:  Acute pain of left shoulder  Stiffness of left shoulder, not elsewhere classified  Muscle weakness (generalized)  Rationale for Evaluation and Treatment Rehabilitation  ONSET DATE: ~ 3 months  SUBJECTIVE:                                                                                                                                                                                      SUBJECTIVE STATEMENT: Patient reports that her  shoulder feels fine, but she woke up 3AM with L sided neck pain, wondering if that is connected to shoulder problems.   Translator did not arrive today, but she was comfortable without video translator today.   PERTINENT HISTORY: No significant PMH  PAIN:  Are you having pain? Yes: NPRS scale: 8/10 Pain location: L side neck  PRECAUTIONS: None  WEIGHT BEARING RESTRICTIONS No  FALLS:  Has patient fallen in last 6 months? 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  Active, goes to [facility], cardio and light resistance  PATIENT GOALS improve L shoulder movement, pain, be able to use L hand at gym  OBJECTIVE:   DIAGNOSTIC FINDINGS:  No imaging available  PATIENT SURVEYS:  Quick Dash 31.8% disability   COGNITION:  Overall cognitive status: Within functional limits for tasks assessed     SENSATION: WFL  POSTURE: Slightly forward head  UPPER EXTREMITY ROM:   Active ROM Right  AROM eval Left AROM eval Left Passive (Supine) Left AROM 03/19/22  Shoulder flexion 167 130 120* 138  Shoulder extension 80 52    Shoulder abduction 170 105 90* 140  Shoulder adduction      Shoulder internal rotation T8/70 sacrum 55   Shoulder external rotation T3/90 occiput 30   (Blank rows = not tested)  UPPER EXTREMITY MMT:  MMT Right eval Left* eval  Shoulder flexion 5 4*  Shoulder extension    Shoulder abduction 5 4*  Shoulder adduction    Shoulder internal rotation 5 4*  Shoulder external rotation 5 4*  Elbow flexion 5 4*  Elbow extension 5 4*  Wrist flexion 5 4*  Wrist extension 5 4*  Grip strength (lbs) good good  (Blank rows = not tested)  *pain  SHOULDER SPECIAL TESTS:  Impingement tests: Painful arc test: positive    JOINT MOBILITY  TESTING:  Decreased mobility L shoulder, guarding due to pain.   PALPATION:  Tenderness anterior L shoulder over biceps and RTC   TODAY'S TREATMENT:  03/26/2022 Therapeutic Exercise: to improve strength and mobility.  Demo, verbal and tactile cues throughout for technique. Pulleys 3 min flexion/3 min scaption Levator stretch - pre and post manual Repeated neck flexion to L - TMR based Shoulder rolls retro Manual Therapy: to decrease muscle spasm and pain and improve mobility.  MHP applied to neck after manual therapy while focusing on shoulder ~ 15 min.  STM/TPR to L levator scapulae, NAGS into rotation, UPA mobs cervical spine, MFR UT/LS Mobs L shoulder all directions to decrease capsular tightness PROM with gentle distraction into ER and IR   03/22/2022 Therapeutic Exercise: to improve strength and mobility.  Demo, verbal and tactile cues throughout for technique. Pulleys x 2 min flexion, x 2 min scaption Finger ladder flexion x 5, abduction x 5  Prayer exercise x 20 Shoulder rows RTB x 20 Shoulder extension RTB x 20 Manual Therapy: to decrease muscle spasm and pain and improve mobility.   Mobs to L shoulder posterior and inferior, PROM intoIR and ER as tolerated.  03/19/22 Therapeutic Exercise: Pulleys - 3 min flex/ 3 min scap L shoulder abduction + flex finger ladder x 5 - only able to do 5 reps d/t fatigue Supine chest press w/ cane x 10 L shoulder flexion AROM supine 2x10 L shld protraction x 10 supine L shld abduction S/L x10  Manual Therapy: STM to L bicep, ant deltoid, infrapsinatus  03/08/2022 Therapeutic Exercise: to improve strength and mobility.  Demo, verbal and tactile cues throughout for technique. Pulleys flexion x 3 min, scaption x 3 min  Finger ladder abduction x 10 L AAROM abduction with cane x 10 L AAROM L shoulder ER with cane x 10  In supine AAROM bil shoulder flexion with cane x 10 Manual Therapy: to decrease muscle spasm and pain and improve  mobility STM to L pec insertion,IASTM L biceps, L extensor  group, STM/TPR posterior shoulder, mobs to L shoulder posterior and inferior, PROM into flexion, abduction, IR and ER as tolerated.     PATIENT EDUCATION: Education details: progressed HEP, information on dry needling as well Person educated: Patient Education method: Consulting civil engineer, Demonstration, Verbal cues, and Handouts Education comprehension: verbalized understanding and returned demonstration   HOME EXERCISE PROGRAM: Access Code: [email or web address]   ASSESSMENT:  CLINICAL IMPRESSION: Annice JERIE BASFORD reported L sided neck pain today, noted significant spasm in L levator scapulae, discussed that this may have been due to abnormal movement patterns, she reported decreased pain after manual therapy and MHP.  L shoulder still very tight into internal and external ROM but improving.   Aleksia BERNADETTE GORES continues to demonstrate potential for improvement and would benefit from continued skilled therapy to address impairments.     OBJECTIVE IMPAIRMENTS decreased mobility, decreased ROM, decreased strength, increased fascial restrictions, increased muscle spasms, impaired flexibility, impaired UE functional use, postural dysfunction, and pain.   ACTIVITY LIMITATIONS carrying, lifting, bending, sleeping, bathing, dressing, reach over head, and hygiene/grooming  PARTICIPATION LIMITATIONS: cleaning and community activity  PERSONAL FACTORS  language barrier  are also affecting patient's functional outcome.   REHAB POTENTIAL: Excellent  CLINICAL DECISION MAKING: Stable/uncomplicated  EVALUATION COMPLEXITY: Low   GOALS: Goals reviewed with patient? Yes  SHORT TERM GOALS: Target date: 03/12/2022  Patient will be independent with initial HEP.  Baseline: given Goal status: MET - 03/19/22   LONG TERM GOALS: Target date: 04/09/2022   Patient will be independent with advanced/ongoing HEP to improve outcomes and carryover.  Baseline:  needs advancement Goal status: IN PROGRESS  2.  Patient will report 75% improvement in left shoulder pain to improve QOL.  Baseline: 5/10 Goal status: IN PROGRESS  3.  Patient to improve L shoulder AROM to WNL without pain provocation to allow for increased ease of ADLs.  Baseline: see objective Goal status: IN PROGRESS  4.  Patient will demonstrate improved functional UE strength as demonstrated by 5/5 L shoulder strength. Baseline: 4/5 with increased pain Goal status: IN PROGRESS  5  Patient will report <20% disability  on QuickDash to demonstrate improved functional ability.  Baseline: 31.8% disability  Goal status: IN PROGRESS   PLAN: PT FREQUENCY: 1-2x/week  PT DURATION: 6 weeks  PLANNED INTERVENTIONS: Therapeutic exercises, Therapeutic activity, Neuromuscular re-education, Balance training, Gait training, Patient/Family education, Self Care, Joint mobilization, Dry Needling, Electrical stimulation, Cryotherapy, Moist heat, Taping, Ionotophoresis 57m/ml Dexamethasone, and Manual therapy  PLAN FOR NEXT SESSION: progress ROM as tolerated, modalities PRN, consider DN to left pectoralis and subscap.   ERennie Natter PT, DPT  03/26/2022, 12:03 PM

## 2022-03-29 ENCOUNTER — Encounter: Payer: Self-pay | Admitting: Physical Therapy

## 2022-03-29 ENCOUNTER — Ambulatory Visit: Payer: 59 | Admitting: Physical Therapy

## 2022-03-29 DIAGNOSIS — M25612 Stiffness of left shoulder, not elsewhere classified: Secondary | ICD-10-CM

## 2022-03-29 DIAGNOSIS — M25512 Pain in left shoulder: Secondary | ICD-10-CM | POA: Diagnosis not present

## 2022-03-29 DIAGNOSIS — M6281 Muscle weakness (generalized): Secondary | ICD-10-CM

## 2022-03-29 NOTE — Therapy (Signed)
OUTPATIENT PHYSICAL THERAPY Treatment Note   Patient Name: Jody Heath MRN: [id number] DOB:[birth year], 51 y.o., female Today's Date: 03/29/2022   PT End of Session - 03/29/22 1010     Visit Number 7    Number of Visits 12    Date for PT Re-Evaluation 04/09/22    Authorization Type [employer]    PT Start Time 1014    PT Stop Time 1101    PT Time Calculation (min) 47 min    Activity Tolerance Patient tolerated treatment well    Behavior During Therapy [facility] for tasks assessed/performed              Past Medical History:  Diagnosis Date   Anemia    Phreesia 01/19/2020   Hyperprolactinemia ([place]) 11/09/2019   Past Surgical History:  Procedure Laterality Date   ABDOMINAL HYSTERECTOMY     ADRENALECTOMY     APPENDECTOMY N/A    Phreesia 01/19/2020   CESAREAN SECTION N/A    Phreesia 01/19/2020   Patient Active Problem List   Diagnosis Date Noted   Adhesive capsulitis of left shoulder 01/25/2022   Sacroiliac joint dysfunction of left side 08/27/2021   Strain of lumbar region 08/12/2021   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[place]) 11/09/2019   Prolactin deficiency ([place]) 11/09/2019   Vitamin D deficiency 11/09/2019   Low back pain 11/09/2019   Facet arthropathy, multilevel 11/09/2019   Thoracic lordosis 11/09/2019   Hyperprolactinemia ([place]) 11/09/2019    PCP: Mackie Pai, PA-C  REFERRING PROVIDER: Rosemarie Ax, MD  REFERRING DIAG: M75.02 (ICD-10-CM) - Adhesive capsulitis of left shoulder  THERAPY DIAG:  Acute pain of left shoulder  Stiffness of left shoulder, not elsewhere classified  Muscle weakness (generalized)  Rationale for Evaluation and Treatment Rehabilitation  ONSET DATE: ~ 3 months  SUBJECTIVE:                                                                                                                                                                                      SUBJECTIVE STATEMENT: Translator available today.   Patient reports seeing improvement, but is sore after massage, has pain when goes to gym, thinks she needs softer massage.  Pain in her neck has improved.    PERTINENT HISTORY: No significant PMH  PAIN:  Are you having pain? Yes: NPRS scale: 2/10 Pain location: L shoulder  PRECAUTIONS: None  WEIGHT BEARING RESTRICTIONS No  FALLS:  Has patient fallen in last 6 months? 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  Active, goes to [facility], cardio and light resistance  PATIENT GOALS improve L shoulder movement, pain, be able to use L hand at gym  OBJECTIVE:   DIAGNOSTIC FINDINGS:  No imaging available  PATIENT SURVEYS:  Quick Dash 31.8% disability   COGNITION:  Overall cognitive status: Within functional limits for tasks assessed     SENSATION: WFL  POSTURE: Slightly forward head  UPPER EXTREMITY ROM:   Active ROM Right  AROM eval Left AROM eval Left Passive (Supine) Left AROM 03/19/22 Left  AROM 03/29/2022  Shoulder flexion 167 130 120* 138 147*  Shoulder extension 80 52   57*  Shoulder abduction 170 105 90* 140 140*  Shoulder adduction       Shoulder internal rotation T8/70 sacrum 55  Waist*/60* supine  Shoulder external rotation T3/90 occiput 30  T1*/60* supine  (Blank rows = not tested)  *pain  UPPER EXTREMITY MMT:  MMT Right eval Left* eval Left  03/29/22  Shoulder flexion 5 4* 4+*  Shoulder extension     Shoulder abduction 5 4* 4+*  Shoulder adduction     Shoulder internal rotation 5 4* 4+*  Shoulder external rotation 5 4* 4+  Elbow flexion 5 4* 4*  Elbow extension 5 4* 5  Wrist flexion 5 4* 4+  Wrist extension 5 4* 4+  Grip strength (lbs) good good good  (Blank rows = not tested)  *pain  SHOULDER SPECIAL  TESTS:  Impingement tests: Painful arc test: positive    JOINT MOBILITY TESTING:  Decreased mobility L shoulder, guarding due to pain.   PALPATION:  Tenderness anterior L shoulder over biceps and RTC   TODAY'S TREATMENT:  03/29/2022 Therapeutic Exercise: to improve strength and mobility.  Demo, verbal and tactile cues throughout for technique. Pulleys x 3 min flexion, x 3 min abduction Cybex rows 5# 1 x 10  GTB rows 2 x 10 GTB shoulder extension 2 x 10  ER stretch with strap  2 x 30 sec hold- then with pulleys 2 x 30 sec hold AROM shoulder flexion 2 x 10  AROM scaption 2 x 10  S/L shoulder ER -with towel roll, no weight Manual Therapy: to decrease muscle spasm and pain and improve mobility STM to L shoulder biceps, pectoralis, anterior deltoid Physical Performance: MMT and ROM   03/26/2022 Therapeutic Exercise: to improve strength and mobility.  Demo, verbal and tactile cues throughout for technique. Pulleys 3 min flexion/3 min scaption Levator stretch - pre and post manual Repeated neck flexion to L - TMR based Shoulder rolls retro Manual Therapy: to decrease muscle spasm and pain and improve mobility.  MHP applied to neck after manual therapy while focusing on shoulder ~ 15 min.  STM/TPR to L levator scapulae, NAGS into rotation, UPA mobs cervical spine, MFR UT/LS Mobs L shoulder all directions to decrease capsular tightness PROM with gentle distraction into ER and IR   03/22/2022 Therapeutic Exercise: to improve strength and mobility.  Demo, verbal and tactile cues throughout for technique. Pulleys x 2 min flexion, x 2 min scaption Finger ladder flexion x 5, abduction x 5  Prayer exercise x 20 Shoulder rows RTB x 20 Shoulder extension RTB x 20 Manual Therapy: to decrease muscle spasm and pain and improve mobility.   Mobs to L shoulder posterior and inferior, PROM intoIR and ER as tolerated.  03/19/22 Therapeutic Exercise: Pulleys - 3 min flex/ 3 min scap L shoulder  abduction +  flex finger ladder x 5 - only able to do 5 reps d/t fatigue Supine chest press w/ cane x 10 L shoulder flexion AROM supine 2x10 L shld protraction x 10 supine L shld abduction S/L x10  Manual Therapy: STM to L bicep, ant deltoid, infrapsinatus  03/08/2022 Therapeutic Exercise: to improve strength and mobility.  Demo, verbal and tactile cues throughout for technique. Pulleys flexion x 3 min, scaption x 3 min  Finger ladder abduction x 10 L AAROM abduction with cane x 10 L AAROM L shoulder ER with cane x 10  In supine AAROM bil shoulder flexion with cane x 10 Manual Therapy: to decrease muscle spasm and pain and improve mobility STM to L pec insertion,IASTM L biceps, L extensor group, STM/TPR posterior shoulder, mobs to L shoulder posterior and inferior, PROM into flexion, abduction, IR and ER as tolerated.     PATIENT EDUCATION: Education details: HEP update 03/29/22 Person educated: Patient Education method: Explanation, Demonstration, Verbal cues, and Handouts Education comprehension: verbalized understanding and returned demonstration   HOME EXERCISE PROGRAM: Access Code: [email or web address]   ASSESSMENT:  CLINICAL IMPRESSION: Rhetta EKATERINA DENISE reported resolution of L neck pain, but continues to report L shoulder pain and soreness.  Trialed some exercise equipment as she was asking what exercises could perform in gym, but still not ready for weights, was able to progress to [place] for shoulder rows and extension without increased pain.  Added additional stretch for shoulder ER, and s/l shoulder ER exercises for strengthening, as while her L shoulder strength and ROM are improving, very limited with shoulder extension, IR and ER making self care difficult.    Carmilla MILLIE FORDE continues to demonstrate potential for improvement and would benefit from continued skilled therapy to address impairments.     OBJECTIVE IMPAIRMENTS decreased mobility, decreased ROM, decreased  strength, increased fascial restrictions, increased muscle spasms, impaired flexibility, impaired UE functional use, postural dysfunction, and pain.   ACTIVITY LIMITATIONS carrying, lifting, bending, sleeping, bathing, dressing, reach over head, and hygiene/grooming  PARTICIPATION LIMITATIONS: cleaning and community activity  PERSONAL FACTORS  language barrier  are also affecting patient's functional outcome.   REHAB POTENTIAL: Excellent  CLINICAL DECISION MAKING: Stable/uncomplicated  EVALUATION COMPLEXITY: Low   GOALS: Goals reviewed with patient? Yes  SHORT TERM GOALS: Target date: 03/12/2022  Patient will be independent with initial HEP.  Baseline: given Goal status: MET - 03/19/22   LONG TERM GOALS: Target date: 04/09/2022   Patient will be independent with advanced/ongoing HEP to improve outcomes and carryover.  Baseline: needs advancement Goal status: IN PROGRESS 03/29/22- met for current, good compliance.   2.  Patient will report 75% improvement in left shoulder pain to improve QOL.  Baseline: 5/10 Goal status: IN PROGRESS 03/29/2022- only a little pain.   3.  Patient to improve L shoulder AROM to WNL without pain provocation to allow for increased ease of ADLs.  Baseline: see objective Goal status: IN PROGRESS - 03/29/22 improved flexion and abduction, still pain at end range, see objective.   4.  Patient will demonstrate improved functional UE strength as demonstrated by 5/5 L shoulder strength. Baseline: 4/5 with increased pain Goal status: IN PROGRESS 03/29/22- 4+/5 with increased pain  5  Patient will report <20% disability  on QuickDash to demonstrate improved functional ability.  Baseline: 31.8% disability  Goal status: IN PROGRESS   PLAN: PT FREQUENCY: 1-2x/week  PT DURATION: 6 weeks  PLANNED INTERVENTIONS: Therapeutic exercises, Therapeutic activity, Neuromuscular re-education, Balance training, Gait training, Patient/Family  education, Self Care, Joint  mobilization, Dry Needling, Electrical stimulation, Cryotherapy, Moist heat, Taping, Ionotophoresis 37m/ml Dexamethasone, and Manual therapy  PLAN FOR NEXT SESSION:  continue to progress ROM and strength as tolerated, modalities PRN, consider DN to left pectoralis and subscap.   ERennie Natter PT, DPT  03/29/2022, 11:58 AM

## 2022-04-02 ENCOUNTER — Ambulatory Visit: Payer: 59

## 2022-04-02 DIAGNOSIS — M6281 Muscle weakness (generalized): Secondary | ICD-10-CM

## 2022-04-02 DIAGNOSIS — M25512 Pain in left shoulder: Secondary | ICD-10-CM

## 2022-04-02 DIAGNOSIS — M25612 Stiffness of left shoulder, not elsewhere classified: Secondary | ICD-10-CM

## 2022-04-02 NOTE — Therapy (Signed)
OUTPATIENT PHYSICAL THERAPY Treatment Note   Patient Name: Jody Heath MRN: [id number] DOB:12/13/1970, 51 y.o., female Today's Date: 04/02/2022      Past Medical History:  Diagnosis Date   Anemia    Phreesia 01/19/2020   Hyperprolactinemia ([place]) 11/09/2019   Past Surgical History:  Procedure Laterality Date   ABDOMINAL HYSTERECTOMY     ADRENALECTOMY     APPENDECTOMY N/A    Phreesia 01/19/2020   CESAREAN SECTION N/A    Phreesia 01/19/2020   Patient Active Problem List   Diagnosis Date Noted   Adhesive capsulitis of left shoulder 01/25/2022   Sacroiliac joint dysfunction of left side 08/27/2021   Strain of lumbar region 08/12/2021   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[place]) 11/09/2019   Prolactin deficiency ([place]) 11/09/2019   Vitamin D deficiency 11/09/2019   Low back pain 11/09/2019   Facet arthropathy, multilevel 11/09/2019   Thoracic lordosis 11/09/2019   Hyperprolactinemia ([place]) 11/09/2019    PCP: Mackie Pai, PA-C  REFERRING PROVIDER: Rosemarie Ax, MD  REFERRING DIAG: M75.02 (ICD-10-CM) - Adhesive capsulitis of left shoulder  THERAPY DIAG:  Acute pain of left shoulder  Stiffness of left shoulder, not elsewhere classified  Muscle weakness (generalized)  Rationale for Evaluation and Treatment Rehabilitation  ONSET DATE: ~ 3 months  SUBJECTIVE:                                                                                                                                                                                      SUBJECTIVE STATEMENT: Translator available today.  Pt reports a little pain in shoulder.  PERTINENT HISTORY: No significant PMH  PAIN:  Are you having pain? Yes: NPRS scale: 3-4/10 Pain location: L shoulder  PRECAUTIONS: None  WEIGHT BEARING RESTRICTIONS No  FALLS:  Has patient fallen in last 6 months? 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  Active, goes to [facility], cardio and light resistance  PATIENT GOALS improve L shoulder movement, pain, be able to use L hand at gym  OBJECTIVE:   DIAGNOSTIC FINDINGS:  No imaging available  PATIENT SURVEYS:  Quick Dash 31.8% disability   COGNITION:  Overall cognitive status: Within functional limits for tasks assessed     SENSATION: WFL  POSTURE: Slightly forward head  UPPER EXTREMITY ROM:   Active ROM Right  AROM eval Left AROM eval Left Passive (Supine) Left AROM 03/19/22 Left  AROM 03/29/2022  Shoulder flexion 167 130  120* 138 147*  Shoulder extension 80 52   57*  Shoulder abduction 170 105 90* 140 140*  Shoulder adduction       Shoulder internal rotation T8/70 sacrum 55  Waist*/60* supine  Shoulder external rotation T3/90 occiput 30  T1*/60* supine  (Blank rows = not tested)  *pain  UPPER EXTREMITY MMT:  MMT Right eval Left* eval Left  03/29/22  Shoulder flexion 5 4* 4+*  Shoulder extension     Shoulder abduction 5 4* 4+*  Shoulder adduction     Shoulder internal rotation 5 4* 4+*  Shoulder external rotation 5 4* 4+  Elbow flexion 5 4* 4*  Elbow extension 5 4* 5  Wrist flexion 5 4* 4+  Wrist extension 5 4* 4+  Grip strength (lbs) good good good  (Blank rows = not tested)  *pain  SHOULDER SPECIAL TESTS:  Impingement tests: Painful arc test: positive    JOINT MOBILITY TESTING:  Decreased mobility L shoulder, guarding due to pain.   PALPATION:  Tenderness anterior L shoulder over biceps and RTC   TODAY'S TREATMENT:  04/02/22 Therapeutic Exercise: UBE L1 2 min fwd/ 97mn back Standing row w/ GTB x 10 5 sec hold Standing extension w/ GTB x 10 5 sec hold Standing IR iso step out with GTB x 10 3 sec hold Standing ER iso step out w/ GTB x 8  Standing shoulder wall  wash flex + scaption and CW/CCW x 10 each  Manual Therapy: STM to L proximal bicep, infraspinatus  03/29/2022 Therapeutic Exercise: to improve strength and mobility.  Demo, verbal and tactile cues throughout for technique. Pulleys x 3 min flexion, x 3 min abduction Cybex rows 5# 1 x 10  GTB rows 2 x 10 GTB shoulder extension 2 x 10  ER stretch with strap  2 x 30 sec hold- then with pulleys 2 x 30 sec hold AROM shoulder flexion 2 x 10  AROM scaption 2 x 10  S/L shoulder ER -with towel roll, no weight Manual Therapy: to decrease muscle spasm and pain and improve mobility STM to L shoulder biceps, pectoralis, anterior deltoid Physical Performance: MMT and ROM   03/26/2022 Therapeutic Exercise: to improve strength and mobility.  Demo, verbal and tactile cues throughout for technique. Pulleys 3 min flexion/3 min scaption Levator stretch - pre and post manual Repeated neck flexion to L - TMR based Shoulder rolls retro Manual Therapy: to decrease muscle spasm and pain and improve mobility.  MHP applied to neck after manual therapy while focusing on shoulder ~ 15 min.  STM/TPR to L levator scapulae, NAGS into rotation, UPA mobs cervical spine, MFR UT/LS Mobs L shoulder all directions to decrease capsular tightness PROM with gentle distraction into ER and IR      PATIENT EDUCATION: Education details: HEP update 03/29/22 Person educated: Patient Education method: EConsulting civil engineer Demonstration, Verbal cues, and Handouts Education comprehension: verbalized understanding and returned demonstration   HOME EXERCISE PROGRAM: Access Code: [email or web address]   ASSESSMENT:  CLINICAL IMPRESSION:  Pt arrived 16 min late to session. Continued working on postural strengthening to improve upright posture and reduce strain on L shoulder complex. Most difficulty noted w/ isometric ER and scaption wall wash. Finished session with STM to L shoulder to reduce tension along musculature in L shoulder.   OBJECTIVE  IMPAIRMENTS decreased mobility, decreased ROM, decreased strength, increased fascial restrictions, increased muscle spasms, impaired flexibility, impaired UE functional use, postural dysfunction, and pain.   ACTIVITY LIMITATIONS carrying, lifting, bending, sleeping, bathing, dressing,  reach over head, and hygiene/grooming  PARTICIPATION LIMITATIONS: cleaning and community activity  PERSONAL FACTORS  language barrier  are also affecting patient's functional outcome.   REHAB POTENTIAL: Excellent  CLINICAL DECISION MAKING: Stable/uncomplicated  EVALUATION COMPLEXITY: Low   GOALS: Goals reviewed with patient? Yes  SHORT TERM GOALS: Target date: 03/12/2022  Patient will be independent with initial HEP.  Baseline: given Goal status: MET - 03/19/22   LONG TERM GOALS: Target date: 04/09/2022   Patient will be independent with advanced/ongoing HEP to improve outcomes and carryover.  Baseline: needs advancement Goal status: IN PROGRESS 03/29/22- met for current, good compliance.   2.  Patient will report 75% improvement in left shoulder pain to improve QOL.  Baseline: 5/10 Goal status: IN PROGRESS 03/29/2022- only a little pain.   3.  Patient to improve L shoulder AROM to WNL without pain provocation to allow for increased ease of ADLs.  Baseline: see objective Goal status: IN PROGRESS - 03/29/22 improved flexion and abduction, still pain at end range, see objective.   4.  Patient will demonstrate improved functional UE strength as demonstrated by 5/5 L shoulder strength. Baseline: 4/5 with increased pain Goal status: IN PROGRESS 03/29/22- 4+/5 with increased pain  5  Patient will report <20% disability  on QuickDash to demonstrate improved functional ability.  Baseline: 31.8% disability  Goal status: IN PROGRESS   PLAN: PT FREQUENCY: 1-2x/week  PT DURATION: 6 weeks  PLANNED INTERVENTIONS: Therapeutic exercises, Therapeutic activity, Neuromuscular re-education, Balance training,  Gait training, Patient/Family education, Self Care, Joint mobilization, Dry Needling, Electrical stimulation, Cryotherapy, Moist heat, Taping, Ionotophoresis 79m/ml Dexamethasone, and Manual therapy  PLAN FOR NEXT SESSION:  continue to progress ROM and strength as tolerated, modalities PRN, consider DN to left pectoralis and subscap.   BArtist Pais PTA 04/02/2022, 10:33 AM

## 2022-04-05 ENCOUNTER — Ambulatory Visit: Payer: 59

## 2022-04-05 DIAGNOSIS — M25612 Stiffness of left shoulder, not elsewhere classified: Secondary | ICD-10-CM

## 2022-04-05 DIAGNOSIS — M25512 Pain in left shoulder: Secondary | ICD-10-CM

## 2022-04-05 DIAGNOSIS — M6281 Muscle weakness (generalized): Secondary | ICD-10-CM

## 2022-04-05 NOTE — Therapy (Addendum)
PHYSICAL THERAPY DISCHARGE SUMMARY  Visits from Start of Care: 9  Current functional level related to goals / functional outcomes: Improved L shoulder ROM for flexion and abduction   Remaining deficits: Limited L shoulder ER and IR   Education / Equipment: HEP  Plan: Patient goals were not met. Patient is being discharged due to not returning to therapy after 30+ days.  She no-showed on 04/09/22 and did not return call to schedule appointment.  She will need new referral to return to physical therapy.    Rennie Natter, PT, DPT 05/10/2022 2:40 PM    OUTPATIENT PHYSICAL THERAPY Treatment Note   Patient Name: Jody Heath MRN: [id number] DOB:10-Oct-1970, 51 y.o., female Today's Date: 04/05/2022   PT End of Session - 04/05/22 1108     Visit Number 9    Number of Visits 12    Date for PT Re-Evaluation 04/09/22    Authorization Type [employer]    PT Start Time 1030   pt late   PT Stop Time 1103    PT Time Calculation (min) 33 min    Activity Tolerance Patient tolerated treatment well    Behavior During Therapy [facility] for tasks assessed/performed               Past Medical History:  Diagnosis Date   Anemia    Phreesia 01/19/2020   Hyperprolactinemia ([place]) 11/09/2019   Past Surgical History:  Procedure Laterality Date   ABDOMINAL HYSTERECTOMY     ADRENALECTOMY     APPENDECTOMY N/A    Phreesia 01/19/2020   CESAREAN SECTION N/A    Phreesia 01/19/2020   Patient Active Problem List   Diagnosis Date Noted   Adhesive capsulitis of left shoulder 01/25/2022   Sacroiliac joint dysfunction of left side 08/27/2021   Strain of lumbar region 08/12/2021   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[place]) 11/09/2019   Prolactin deficiency ([place]) 11/09/2019   Vitamin D deficiency 11/09/2019   Low back pain 11/09/2019   Facet arthropathy, multilevel 11/09/2019   Thoracic lordosis 11/09/2019   Hyperprolactinemia ([place]) 11/09/2019    PCP: Mackie Pai,  PA-C  REFERRING PROVIDER: Rosemarie Ax, MD  REFERRING DIAG: M75.02 (ICD-10-CM) - Adhesive capsulitis of left shoulder  THERAPY DIAG:  Acute pain of left shoulder  Stiffness of left shoulder, not elsewhere classified  Muscle weakness (generalized)  Rationale for Evaluation and Treatment Rehabilitation  ONSET DATE: ~ 3 months  SUBJECTIVE:                                                                                                                                                                                      SUBJECTIVE  STATEMENT: Translator available today. Pt notes having pain when exercising but as she exercises for longer she notices less pain. No pain at the moment.  PERTINENT HISTORY: No significant PMH  PAIN:  Are you having pain? No  PRECAUTIONS: None  WEIGHT BEARING RESTRICTIONS No  FALLS:  Has patient fallen in last 6 months? 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  Active, goes to [employer], cardio and light resistance  PATIENT GOALS improve L shoulder movement, pain, be able to use L hand at gym  OBJECTIVE:   DIAGNOSTIC FINDINGS:  No imaging available  PATIENT SURVEYS:  Quick Dash 31.8% disability   COGNITION:  Overall cognitive status: Within functional limits for tasks assessed     SENSATION: WFL  POSTURE: Slightly forward head  UPPER EXTREMITY ROM:   Active ROM Right  AROM eval Left AROM eval Left Passive (Supine) Left AROM 03/19/22 Left  AROM 03/29/2022  Shoulder flexion 167 130 120* 138 147*  Shoulder extension 80 52   57*  Shoulder abduction 170 105 90* 140 140*  Shoulder adduction       Shoulder internal rotation T8/70 sacrum 55  Waist*/60* supine  Shoulder external rotation T3/90  occiput 30  T1*/60* supine  (Blank rows = not tested)  *pain  UPPER EXTREMITY MMT:  MMT Right eval Left* eval Left  03/29/22  Shoulder flexion 5 4* 4+*  Shoulder extension     Shoulder abduction 5 4* 4+*  Shoulder adduction     Shoulder internal rotation 5 4* 4+*  Shoulder external rotation 5 4* 4+  Elbow flexion 5 4* 4*  Elbow extension 5 4* 5  Wrist flexion 5 4* 4+  Wrist extension 5 4* 4+  Grip strength (lbs) good good good  (Blank rows = not tested)  *pain  SHOULDER SPECIAL TESTS:  Impingement tests: Painful arc test: positive    JOINT MOBILITY TESTING:  Decreased mobility L shoulder, guarding due to pain.   PALPATION:  Tenderness anterior L shoulder over biceps and RTC   TODAY'S TREATMENT:  04/05/22 Therapeutic Exercise: UBE L1 2 min fwd/ 98mn back Standing: Isometric IR w/ GTB x 10 - towel to side Isometric ER w/ GTB x 10 - towel to side STAnding shoulder mid horizontal ABD GTB x 10 Horizontal ABD GTB x 10 against doorframe ER w/ GTB x 10 against doorframe Wall wash 2 x 10 flexion,scaption, CW/CCW circles  Wall push ups 2x10  Manual Therapy: STM to L proximal biceps, teres group, infraspinatus  04/02/22 Therapeutic Exercise: UBE L1 2 min fwd/ 283m back Standing row w/ GTB x 10 5 sec hold Standing extension w/ GTB x 10 5 sec hold Standing IR iso step out with GTB x 10 3 sec hold Standing ER iso step out w/ GTB x 8  Standing shoulder wall wash flex + scaption and CW/CCW x 10 each  Manual Therapy: STM to L proximal bicep, infraspinatus  03/29/2022 Therapeutic Exercise: to improve strength and mobility.  Demo, verbal and tactile cues throughout for technique. Pulleys x 3 min flexion, x 3 min abduction Cybex rows 5# 1 x 10  GTB rows 2 x 10 GTB shoulder extension 2 x 10  ER stretch with strap  2 x 30 sec hold- then with pulleys 2 x  30 sec hold AROM shoulder flexion 2 x 10  AROM scaption 2 x 10  S/L shoulder ER -with towel roll, no weight Manual Therapy:  to decrease muscle spasm and pain and improve mobility STM to L shoulder biceps, pectoralis, anterior deltoid Physical Performance: MMT and ROM         PATIENT EDUCATION: Education details: HEP update 03/29/22 Person educated: Patient Education method: Consulting civil engineer, Demonstration, Verbal cues, and Handouts Education comprehension: verbalized understanding and returned demonstration   HOME EXERCISE PROGRAM: Access Code: [email or web address]   ASSESSMENT:  CLINICAL IMPRESSION: Pt arrived 15 min late to session. Continued to progress scap stab and postural strengthening. Cues provided throughout session to correct form and target the correct muscles. Pt showed good response w/ no increased pain only muscle fatigue noted. Will need progress note next visit to determine recert v. D/C.   OBJECTIVE IMPAIRMENTS decreased mobility, decreased ROM, decreased strength, increased fascial restrictions, increased muscle spasms, impaired flexibility, impaired UE functional use, postural dysfunction, and pain.   ACTIVITY LIMITATIONS carrying, lifting, bending, sleeping, bathing, dressing, reach over head, and hygiene/grooming  PARTICIPATION LIMITATIONS: cleaning and community activity  PERSONAL FACTORS  language barrier  are also affecting patient's functional outcome.   REHAB POTENTIAL: Excellent  CLINICAL DECISION MAKING: Stable/uncomplicated  EVALUATION COMPLEXITY: Low   GOALS: Goals reviewed with patient? Yes  SHORT TERM GOALS: Target date: 03/12/2022  Patient will be independent with initial HEP.  Baseline: given Goal status: MET - 03/19/22   LONG TERM GOALS: Target date: 04/09/2022   Patient will be independent with advanced/ongoing HEP to improve outcomes and carryover.  Baseline: needs advancement Goal status: IN PROGRESS 03/29/22- met for current, good compliance.   2.  Patient will report 75% improvement in left shoulder pain to improve QOL.  Baseline: 5/10 Goal status: IN PROGRESS  03/29/2022- only a little pain.   3.  Patient to improve L shoulder AROM to WNL without pain provocation to allow for increased ease of ADLs.  Baseline: see objective Goal status: IN PROGRESS - 03/29/22 improved flexion and abduction, still pain at end range, see objective.   4.  Patient will demonstrate improved functional UE strength as demonstrated by 5/5 L shoulder strength. Baseline: 4/5 with increased pain Goal status: IN PROGRESS 03/29/22- 4+/5 with increased pain  5  Patient will report <20% disability  on QuickDash to demonstrate improved functional ability.  Baseline: 31.8% disability  Goal status: IN PROGRESS   PLAN: PT FREQUENCY: 1-2x/week  PT DURATION: 6 weeks  PLANNED INTERVENTIONS: Therapeutic exercises, Therapeutic activity, Neuromuscular re-education, Balance training, Gait training, Patient/Family education, Self Care, Joint mobilization, Dry Needling, Electrical stimulation, Cryotherapy, Moist heat, Taping, Ionotophoresis 71m/ml Dexamethasone, and Manual therapy  PLAN FOR NEXT SESSION:  continue to progress ROM and strength as tolerated, modalities PRN, consider DN to left pectoralis and subscap.   BArtist Pais PTA 04/05/2022, 11:10 AM

## 2022-04-06 ENCOUNTER — Other Ambulatory Visit (INDEPENDENT_AMBULATORY_CARE_PROVIDER_SITE_OTHER): Payer: 59

## 2022-04-06 DIAGNOSIS — D352 Benign neoplasm of pituitary gland: Secondary | ICD-10-CM | POA: Diagnosis not present

## 2022-04-07 LAB — PROLACTIN: Prolactin: 21.3 ng/mL (ref 4.8–23.3)

## 2022-04-09 ENCOUNTER — Ambulatory Visit: Payer: 59 | Admitting: Physical Therapy

## 2022-04-09 ENCOUNTER — Ambulatory Visit: Payer: 59 | Admitting: Endocrinology

## 2022-04-09 NOTE — Progress Notes (Deleted)
Patient ID: Jody Heath, female   DOB: 09/11/1970, 51 y.o.   MRN: [id number]              Chief complaint: Follow-up of high prolactin  History of Present Illness  Prior history: She apparently had problems with milk discharge from her breasts in  2013 and was found to have high prolactin level of about 130 She was diagnosed in [place] In 2016 her MRI scan showed a nonenhancing mass measuring 4x 5 mm She thinks that with taking cabergoline half tablet twice a week her prolactin level had come down and after about 1-1/2 years the medication was stopped but she did not have a follow-up subsequently  Recent history:  In 3/21 her prolactin level was 109 on her initial visit She had not complained of any spontaneous milky secretions from her breasts at that time  She has had a hysterectomy and cannot assess menstrual cycles  Because of significant hyperprolactinemia she was started on cabergoline in 11/2019 She has been continued on cabergoline 0.25 mg, now taking weekly  For some time has not noticed any spontaneous milky secretions but she usually does not try to check herself  She has had some hot flushes again and more consistent, has mild to moderate symptoms only [place] indicates likely menopause   Prolactin levels:  Lab Results  Component Value Date   PROLACTIN 21.3 04/06/2022   PROLACTIN 8.4 01/01/2022   PROLACTIN 12.8 07/13/2021   PROLACTIN 15.8 01/06/2021    Since previous MRI was not available repeat imaging was done MRI of pituitary gland showed the following results  MRI of pituitary gland, 12/26/2019:  Asymmetric pituitary gland mostly flattened against the sellar floor with asymmetric contrast enhancement along the medial wall of the left cavernous sinus.  No well defined hypoenhancing lesion identified.   Report from 2018 not available  She has no other evidence of hypopituitarism Last thyroid functions also normal  Lab Results  Component Value  Date   TSH 4.24 06/01/2021   TSH 3.930 11/02/2019   FREET4 0.85 01/01/2022   FREET4 0.73 06/01/2021   FREET4 0.74 12/31/2019     Allergies as of 04/09/2022       Reactions   Iodine Hives        Medication List        Accurate as of April 09, 2022 10:47 AM. If you have any questions, ask your nurse or doctor.          albuterol 108 (90 Base) MCG/ACT inhaler Commonly known as: VENTOLIN HFA Inhale 2 puffs into the lungs every 6 (six) hours as needed.   cabergoline 0.5 MG tablet Commonly known as: DOSTINEX TAKE 1/2 TABLETS BY MOUTH 2 TIMES A WEEK.   lidocaine 5 % Commonly known as: Lidoderm Place 1 patch onto the skin daily. Remove & Discard patch within 12 hours or as directed by MD   methocarbamol 500 MG tablet Commonly known as: ROBAXIN Take 1 tablet (500 mg total) by mouth 2 (two) times daily.   naproxen 500 MG tablet Commonly known as: NAPROSYN Take 1 tablet (500 mg total) by mouth 2 (two) times daily.   Vitamin D (Ergocalciferol) 1.25 MG (50000 UNIT) Caps capsule Commonly known as: DRISDOL Take 1 capsule (50,000 Units total) by mouth every 7 (seven) days.        Allergies:  Allergies  Allergen Reactions   Iodine Hives    Past Medical History:  Diagnosis Date   Anemia  Phreesia 01/19/2020   Hyperprolactinemia ([place]) 11/09/2019    Past Surgical History:  Procedure Laterality Date   ABDOMINAL HYSTERECTOMY     ADRENALECTOMY     APPENDECTOMY N/A    Phreesia 01/19/2020   CESAREAN SECTION N/A    Phreesia 01/19/2020    Family History  Problem Relation Age of Onset   Diabetes Mother    Stroke Mother    Stroke Paternal Uncle    Kidney failure Father     Social History:  reports that she has never smoked. She has never used smokeless tobacco. She reports current alcohol use. She reports that she does not use drugs.   Review of Systems  She has vitamin D deficiency on routine testing and is being treated by PCP   No history of  headaches recently  EXAM:  There were no vitals taken for this visit.  Physical Exam   ASSESSMENT:     Prolactinoma with baseline hyperprolactinemia with a level of 109 MRI of the pituitary gland in 5/21 suggested empty sella syndrome although she had a 5 mm prolactinoma seen in 2016 previously  She is now only on 0.25 mg cabergoline weekly with consistent improvement in prolactin and gradual decrease in the level No spontaneous galactorrhea present again Unable to assess her menstrual cycles because of hysterectomy  With her hot flashes an [facility] of about 25 she is likely menopause     PLAN:     If estradiol is also low as expected her Dostinex can be stopped and she can follow-up in 2 months  Elayne Snare 04/09/22   Note: This office note was prepared with Dragon voice recognition system technology. Any transcriptional errors that result from this process are unintentional.

## 2022-04-14 DIAGNOSIS — D649 Anemia, unspecified: Secondary | ICD-10-CM | POA: Insufficient documentation

## 2022-04-15 ENCOUNTER — Ambulatory Visit: Payer: 59 | Attending: Cardiology | Admitting: Cardiology

## 2022-04-15 ENCOUNTER — Ambulatory Visit: Payer: 59 | Attending: Cardiology

## 2022-04-15 ENCOUNTER — Encounter: Payer: Self-pay | Admitting: Cardiology

## 2022-04-15 DIAGNOSIS — R011 Cardiac murmur, unspecified: Secondary | ICD-10-CM

## 2022-04-15 DIAGNOSIS — R002 Palpitations: Secondary | ICD-10-CM | POA: Insufficient documentation

## 2022-04-15 DIAGNOSIS — R079 Chest pain, unspecified: Secondary | ICD-10-CM

## 2022-04-15 HISTORY — DX: Palpitations: R00.2

## 2022-04-15 HISTORY — DX: Cardiac murmur, unspecified: R01.1

## 2022-04-15 HISTORY — DX: Chest pain, unspecified: R07.9

## 2022-04-15 NOTE — Patient Instructions (Signed)
Medication Instructions:  Your physician recommends that you continue on your current medications as directed. Please refer to the Current Medication list given to you today.  *If you need a refill on your cardiac medications before your next appointment, please call your pharmacy*   Lab Work: None ordered If you have labs (blood work) drawn today and your tests are completely normal, you will receive your results only by: [place] (if you have MyChart) OR A paper copy in the mail If you have any lab test that is abnormal or we need to change your treatment, we will call you to review the results.   Testing/Procedures:      Stress Echocardiogram Information Sheet                                                      Instructions:    1. You may take your morning medications the morning of the test  2. Light breakfast.  3. Dress prepared to exercise.  4. DO NOT use ANY caffeine or tobacco products 3 hours before appointment.  5. Please bring all current prescription medications.   WHY IS MY DOCTOR PRESCRIBING ZIO? The Zio system is proven and trusted by physicians to detect and diagnose irregular heart rhythms -- and has been prescribed to hundreds of thousands of patients.  The FDA has cleared the Zio system to monitor for many different kinds of irregular heart rhythms. In a study, physicians were able to reach a diagnosis 90% of the time with the Zio system1.  You can wear the Zio monitor -- a small, discreet, comfortable patch -- during your normal day-to-day activity, including while you sleep, shower, and exercise, while it records every single heartbeat for analysis.  1Barrett, P., et al. Comparison of 24 Hour Holter Monitoring Versus 14 Day Novel Adhesive Patch Electrocardiographic Monitoring. [place], 2014.  ZIO VS. HOLTER MONITORING The Zio monitor can be comfortably worn for up to 14 days. Holter monitors can be worn for 24 to 48 hours,  limiting the time to record any irregular heart rhythms you may have. Zio is able to capture data for the 51% of patients who have their first symptom-triggered arrhythmia after 48 hours.1  LIVE WITHOUT RESTRICTIONS The Zio ambulatory cardiac monitor is a small, unobtrusive, and water-resistant patch--you might even forget you're wearing it. The Zio monitor records and stores every beat of your heart, whether you're sleeping, working out, or showering. Wear the monitor for 14 days, remove 04/29/22.  Follow-Up: At [facility], you and your health needs are our priority.  As part of our continuing mission to provide you with exceptional heart care, we have created designated Provider Care Teams.  These Care Teams include your primary Cardiologist (physician) and Advanced Practice Providers (APPs -  Physician Assistants and Nurse Practitioners) who all work together to provide you with the care you need, when you need it.  We recommend signing up for the patient portal called "MyChart".  Sign up information is provided on this After Visit Summary.  MyChart is used to connect with patients for Virtual Visits (Telemedicine).  Patients are able to view lab/test results, encounter notes, upcoming appointments, etc.  Non-urgent messages can be sent to your provider as well.   To learn more about what you can do with MyChart, go  to [email or web address].    Your next appointment:   12 month(s)  The format for your next appointment:   In Person  Provider:   Jyl Heinz, MD   Other Instructions Exercise Stress Echocardiogram An exercise stress echocardiogram is a test to check how well your heart is working. This test uses sound waves and a computer to make pictures of your heart. These pictures will be taken before and after you exercise. For this test, you will walk on a treadmill or ride a bicycle to make your heart beat faster. While you exercise, your heart will be checked with an  electrocardiogram (ECG). Your blood pressure will also be checked. You may have this test if: You have chest pain or a heart problem. You had a heart attack or heart surgery not long ago. You have heart valve problems. You have a condition that causes narrowing of the blood vessels that supply your heart. You have a high risk of heart disease and: You are starting a new exercise program. You need to have a big surgery. Tell a doctor about: Any allergies you have. All medicines you are taking. This includes vitamins, herbs, eye drops, creams, and over-the-counter medicines. Any problems you or family members have had with medicines that make you fall asleep (anesthetic medicines). Any surgeries you have had. Any blood disorders you have. Any medical conditions you have. Whether you are pregnant or may be pregnant. What are the risks? Generally, this is a safe test. However, problems may occur, including: Chest pain. Feeling dizzy or light-headed. Shortness of breath. Increased or irregular heartbeat. Feeling like you may vomit (nausea) or vomiting. Heart attack. This is very rare. What happens before the test? Medicines Ask your doctor about changing or stopping your normal medicines. This is important if you take diabetes medicines or blood thinners. If you use an inhaler, bring it to the test. General instructions Wear comfortable clothes and walking shoes. Follow instructions from your doctor about what you cannot eat or drink before the test. Do not drink or eat anything that has caffeine in it. Stop having caffeine 24 hours before the test. Do not smoke or use products that contain nicotine or tobacco for 4 hours before the test. If you need help quitting, ask your doctor. What happens during the test?  You will take off your clothes from the waist up and put on a hospital gown. Electrodes or patches will be put on your chest. A blood pressure cuff will be put on your  arm. Before you exercise, a computer will make a picture of your heart. To do this: You will lie down and a gel will be put on your chest. A wand will be moved over the gel. Sound waves from the wand will go to the computer to make the picture. Then, you will start to exercise. You may walk on a treadmill or pedal a bicycle. Your blood pressure and heart rhythm will be checked while you exercise. The exercise will get harder or faster. You will exercise until: Your heart reaches a certain level. You are too tired to go on. You cannot go on because of chest pain, weakness, or dizziness. You will lie down right away so another picture of your heart can be taken. The procedure may vary among doctors and hospitals. What can I expect after the test? After your test, it is common to have: Mild soreness. Mild tiredness. Your heart rate and blood pressure will be checked until they  return to your normal levels. You should not have any new symptoms after this test. Follow these instructions at home: If your doctor says that you can, you may: Eat what you normally eat. Do your normal activities. Take over-the-counter and prescription medicines only as told by your doctor. Keep all follow-up visits. It is up to you to get the results of your test. Ask how to get your results when they are ready. Contact a doctor if: You feel dizzy or light-headed. You have a fast or irregular heartbeat. You feel like you may vomit or you vomit. You have a headache. You feel short of breath. Get help right away if: You develop pain or pressure: In your chest. In your jaw or neck. Between your shoulders. That goes down your left arm. You faint. You have trouble breathing. These symptoms may be an emergency. Get medical help right away. Call your local emergency services (911 in the [place] Do not wait to see if the symptoms will go away. Do not drive yourself to the hospital. Summary This is a test that  checks how well your heart is working. Follow instructions about what you cannot eat or drink before the test. Ask your doctor if you should take your normal medicines before the test. Stop having caffeine 24 hours before the test. Do not smoke or use products with nicotine or tobacco in them for 4 hours before the test. During the test, your blood pressure and heart rhythm will be checked while you exercise. This information is not intended to replace advice given to you by your health care provider. Make sure you discuss any questions you have with your health care provider. Document Revised: 04/15/2021 Document Reviewed: 03/25/2020 Elsevier Patient Education  [place].  ?????? ???????- ???? ??? ????? ??? ???? ?????? ?????? ?????. ????? ?? ??????? ???? ????? ???? ?????? *???? ???? ??? ??? ???????? ???? ???? ????? ?? ????? ????*   ?????? ??? ??? ??????? ?? ???? ??????? (??? ??) ???? ?? ??????? ?? ??? ???? ??? ???? ?????? ?:  MyChart ????? (MyChart ????) ???  ????? ?? ???? ????? ?????? ?????? ???? ??? ??? ?????? ???? ?????? ???? ???????? ???????? yemedihan?ti memer?yawochi : - h??k?miwo inide memer?yawi ?huni baluwoti medihan?tochi inid?k'et'ilu yimekirali. ibakotini zar? yeteset'ewotini ye'?huni medihan?ti ziriziri yimeliketu. *kek'et'ayi k'et'erowo bef?ti yelibi medihan?tochiwoni memulati kefelegu ibakiwoni wede farimas?wo yidewilu*   yelabirator? sira :  minimi ?litazezemi. zar? yetesalu laborator?wochi (yedemi sira) kalewoti ina mirimerawochiwo mulu bemulu medebenya kehonu wit'?tuni yem?k'ebeluti be:  Nurse, learning disability Engineer, mining) weyimi  yewerek'eti k'ij? beposita maninyawimi yalitelemede yelabirator? mirimera kalehi weyimi hikiminahini melewet'i kasifelegeni wit'?tuni iniditigemegimi init'erahaleni.  ????/?????-       ???? Echocardiogram ??? ??                                                       ???????-    1. ????? ??? ???? ???????? ???? ????  2. ???  ???.  3. ?????? ????? ???.  4. ???? 3 ??? ??? ??? ???? ????? ??? ????? ????? ??????  5. ????? ???? ???? ???? ?????? ??? ???.   ?????? ??? ??? ??????? ??? ???? ???? ???? ??? ??? ????? ?? ?????? ????? ?????? ?? ???? ?? - ?? ??? ??? ????? ????? ???? ???  ???? ??? ????? ????? ?? ???? ???? ???? ??? ??? ????? ???? ??, ????? ?  Zio system1 ??? 90% ?? ???? ?? ???? ????.  ??? ????? ???? ????? - ??? ? ??? ? ?? - ???? ???? ??????? ? ??????? ?? ? ???????? ?? ?????????? ?? ? ??????? ??? ?? ?????? ???????  1 ???? ?.? ?? ???? ?24 ??? ???? ???? ?? ?14 ?? ?? ??? ???? ?????????????? ???? ????? ????? ???? ?? ????? 2014  ZIO VS. HOLTER ???? ??? ???? ???? ??? 14 ??? ???? ????? ????? ????? ?24 ??? 48 ???? ???? ????? ??? ????? ???? ???? ??? ?? ?????? ??? ?????? Zio ? 48 ???? ??? ?????? ?? ??????-??????? arrhythmia ????? 51% ????? ???? ??? ????.  ?? ??? ??? ??? ?????? ??? ?????? ??? ? ?????? ?? ?? ?????? ?? ?? - ??????? ???? ??? ????? ?? ????? ????? ??? ???? ?????? ??????? ??? ?? ?????? ?? ?????? ????? ?14 ??? ????? 04/29/22? ?????? memedebi/h?detochi : -      wit'ireti Echocardiogram mereja luhi                                                      memer?yawochi : -    1. befetenawi t'ewati yet'ewati medihan?tochiwoni mewisedi yichilalu  2. k'elali k'urisi.  3. lemelemamedi yetezegaje libisi.  4. kek'et'ero 3 se'?ti bef?ti minimi ?yineti yekaf?yini weyimi yetimibaho miritochini ?yit'ek'emu.  5. ibakiwoni hulunimi wek'itaw? yeh??k?mi medihan?tochi yizewi yimit'u.   leminidinewi h?k?m? t?s'?yoni yem?yazizewi? yez?yo siri'ati medebenya yalihone yelibi mitini lemeleyeti ina lememerimeri beh?k?mochi yeteregaget'e ina yetamene newi - ina bemeto sh?wochi lem?k'ot'eru takam?wochi yetazeze newi.  ?fid?'? yez?yo siri'atini ?ts'iditwali bizu ?yineti medebenya yalihonu yelibi mitochi ?yineti. bet'inati layi, dokiterochi be Zio system1 wisit'i 90% g?z? mirimera layi mediresi chilewali.  yez?yo mon?teruni  melibesi tichilalehi - tinishi , libami , michu - be'ileti te'ileti inik'isik'as?hi , bemititenyabeti g?z? , bemititat'ebibeti ina bemitinik'esak'esibeti g?z? , iyanidaniduni yelibi miti lemeteniteni yimezegibali.  1 bar?ti, p?., ina l?lochi. ye24 se'ati holiteri kititili ina ye14 k'eni libi weledi telet'af? ye'?l?kitirokikayogiraf?yaw? kititili nits'its'iri. ?m?r?kani jorinali ovi m?d?kali, 2014  ZIO VS. HOLTER kititili yez?yo masayawi bemichoti isike 14 k'enati l?lebesi yichilali. yeholiteri masayawochi ke24 isike 48 se'?tati l?lebesu yichilalu, yihimi yalewotini medebenya yalihone yelibi miti lememezigebi g?z?ni yigedibali. Zio ke 48 se'?tati beh?wala lemejemer?ya g?z? milikitachewi-yem?k'esek'isewi arrhythmia lalebachewi 51% takam?wochi merejani meyazi yichilali.  yale gedebi yinuru yez?yo ?mibulator? yelibi mek'ot'at'er?ya tinishi , yemayirebishi ina wiha yemayik'wak'wami pach? newi - inidelebesuti inikwani l?resu yichilalu. z?yo tenyitehi, iyeserahi weyimi gelahini iyetat'ebiki iyanidaniduni yelibi miti yimezegibali ina yakemachali. masayawini le14 k'enati yilibesu, 9/14/23ni yasiwegidu.  ????: ?[facility]? ???? ?? ??? ?????? ???? ??????? ???? ?? ??? ??????? ????? ????? ??? ??? ??????? ???? ????? ????? ?????? ????? ?????? ???? ??????? ???? ???? ??????? ?????? ??????? ?? ????? ????? ???? ?????? ?????? ??? ??? ??? (???) ?? ??? ???? ??????? (???? - ??? ???? ?? ??? ???????) ??????  "MyChart" ????? ???? ???? ????? ???????? ????? ????? ??? ????? ??? ??? ?? ?????? MyChart ?????? ?? ?????? ????? (Telemedicine) ?????? ?????? ????? ??????/???? ???????????? ??????????? ?????????? ??? ?????????? ???? ?????? ?? ????? ??? ????? ?MyChart ?? ???? ?????? ???? ???? ?? [email or web address] ????  ??? ?????- 12 ???  ???? ???? ?????- ????  ????? Jyl Heinz, MD kititili: [place], irisiwo ina yet'?na filagotochiwo k'idim?ya yeminiset'achewi nachewi. liyu yelibi inikibikab?ni le'irisiwo  lemak'irebi inide k'et'ayi teli'iko'?chini ?kali, yeteseyemu ye'?k'irab? inikibikab? budinochini fet'irenali. inez?hi ye'inikibikab? budinochi irisiwo yem?feligutini inikibikab? bem?feligubeti g?z? le'irisiwo lemesit'eti ?birewi yem?serutini yemejemer?ya dereja yelibi  h??k?mi (h??k?mi) ina yelak'e limimidi ?k'irab?wochini (?p?'?si - h??k?mi redatochi ina nerisi balemuyawochini) yakatitalu.  "MyChart" letebalewi yetakam? poritali memezigebi inimekiraleni. kegubinyiti mat'ek'aleya beh?wala yemizigeba mereja bez?hi layi teset'itwali. MyChart ketakam?wochi gari leverichuwali gubinyitochi (Telemedicine) lemegenanyeti yit'ek'imali. takam?wochi yelabirator?/yefetena wit'?tochini,yemasitawesha masitaweshawochini,mech'? k'et'erowochini,wezete mayeti yichilalu.?sichekwayi yalihonu meli'ikitochi wede ?k'irab?wo l?laku yichilalu. beMyChart mini madiregi inidem?chilu yebelet'e lemawek'i wede [email or web address] yih?du.  k'et'ayu k'et'erowo : - 12 werati  lek'et'ayi k'et'erowo k'irits'eti : - be'?kali  ?k'irab? :  Jyl Heinz, MD   ??? ?????? ???? ??? ?????? ???? Echocardiogram ???? ??? ?????? ???? echocardiogram ??? ?? ??? ?????? ????? ????? ??? ??? ?? ??? ????? ???? ????? ???? ????? ?? ??????? ?????? ???? ???? ???? ??? ?????? ?????? ??? ?? ??? ?????. ??? ????? ??? ?? ????? ????? ?????? ?? ????? ??? ?????? ?????? ???? ??? ?????? ???????? ?? ??? ???????????? (?.?.?.) ??????? ??? ????? ??????? ?????? ??? ???? ???? ???? ????-  ???? ??? ??? ??? ??? ??????  ??? ???? ??? ??? ??? ??? ?? ??? ??????  ??? ??? ??? ??????  ???? ?????? ??? ??? ???? ??????? ??? ??????  ??? ??? ???? ?????? ???- o ??? ???? ??? ?????? ????? ?????? ??? o ??? ?? ??? ???? ????????? ???? ???-  ????? ???? ??????  ??????? ?????? ???? ?? ?????, ????, ???? ?????, ???? ?? ????? ???? ??????? ??????.  ???? ??? ????? ???? ???? ????? ?????? ?????? ?????? (????? ??????) ?????? ????? ????  ??????? ????? ?? ????  ???? ????? ??? ????  ????  ????? ??? ????  ???? ??? ??? ???? ??? ????? ????? ???? ???? ?????? ?? ?????? ??? ??? ??? ???? ???? ????, ??????? ???:  ???? ???.  ???? ??? ??? ??? ??????  ????? ?????  ??? ?? ???? ??? ???? ?????  ????? (??????) ??? ????? ?????? ???????  ??? ???. ?? ??? ??? ??? ??. ????? ??? ?? ????? ??????  ????? ???????? ?????? ??? ????? ?????? ????? ???? ??? ??????? ??? ??? ??????? ???? ?? ????? ??.  ?????? ????? ?? ???? ????? ????? ??????  ?? ????? ?? ???? ????? ?????  ?????? ??? ????? ???? ??????? ??????? ?????? ?????  ???? ???? ???? ?????? ??? ???? ??? ????? ????? ? 24 ???? ??? ???? ???? ????  ????? ??? ? 4 ???? ??? ???? ??? ???? ??? ????? ???? ??? ?????? ???? ???? ???? ????? ????. ???? ??? ?? ?????    ????? ???? ?? ?? ????? ?????? ??? ???????  ??????? ??? ????? ????? ?? ?????.  ????? ?? ??? ??? ???? ??????  ???? ??? ?????? ?????? ??? ?????? ????? ??? ????? ???? ??????- o ????? ?? ????? ?? ?? ?????? o ??? ??? ?? ???????? o ???? ????? ???? ???? ???? ???? ?? ?????? ?????  ??? ???? ??? ?????? ???? ??????? ?????? ??? ?????? ??? ?? ???? ??????  ???? ??? ?????? ???????? ?? ??? ???? ?? ??? ??? ???????  ???? ??? ??????? ???? ??? ??? ??? ?????  ???????? ??? ???? ??? ?????? ??????- o ??? ????? ??? ?? ????? o ????? ??? ?????? o ???? ???? ???? ??? ???? ????? ???? ??????  ?? ???? ??? ????? ????? ??????? ??? ?????? ?? ??????? ??? ???? ????. ???? ??? ?? ???? ??????  ????? ???? ??????? ???? ????? ??? o ???? ???? o ???? ???? ?? ????? ???? ?????? ??? ??? ??? ?? ??? ???? ???? ????????? ??? ???? ??? ??? ???? ??? ????? ????? ?????. ??? ??? ??????? ?????? ????:  ????? ?????? ?????? ??????? ???? ?????- o ????? ?????? ???? o ???? ?????????? ?????  ????? ??? ?? ???? ???? ??????? ???? ????? ???????? ???  ???? ????? ????? ????  ?????? ??? ???? ????? ??? ??? ??? ??? ?????? ???? ???? ?????? ????? ?????? ??? ??? ?????:  ??? ??? ??? ????? ???????  ??? ??? ???? ???? ??? ?? ?????  ????? ??? ????? ??????  ???????  ?? ??? ?????  ????? ???? ??????? ?????? ??? ????? ???? ????-  ??? ??? ??? ????????-  o ????? ???? o ????? ??? ????? ??? o ????? ????? o ??? ???? ?? ??????  ???????  ?????? ??? ?????? ???? ????? ????? ??? ????. ????? ????? ???? ???. ?????? ?????? ??? ?????? ???? (911 ? [place]?  ????? ?????? ???? ??????  ???? ?? ????? ???????? ?????  ?? ??? ?? ??? ?????? ????? ??? ???  ????? ??? ????? ???? ??????? ?????? ????? ?????? ??? ????? ???????? ???? ??????? ?????? ????.  ?????? 24 ??? ??? ???? ???? ????  ????? ??? ? 4 ???? ??? ???? ??? ?????? ???? ??? ???? ????? ???? ??????  ?????? ??? ???? ??? ?????? ???????? ?? ??? ???? ?? ??? ??? ??????? ?? ??? ??? ?????? ????? ??????? ??? ????? ???? ?????? ??? ?????? ????? ?? ??????? ?????? ??? ??????? ?????? ?????? ???? 04/15/2021 ??? l?lochi memer?yawochi ye'?kali bik'ati inik'isik'as? wit'ireti Echocardiogram ye'?kali bik'ati inik'isik'as? wit'ireti echocardiogram libiwo mini yahili inidem?sera lemefeteshi yem?deregi mukera newi. yihi mukera yelibiwoni misilochi lemesirati yedimits'i mogedochini ina komip?witerini yit'ek'emali. inez?hi si'ilochi ye'?kali bik'ati inik'isik'as? kemadiregiwo bef?ti ina beh?wala yiwesedalu. lez?hi mirimera, libiwo tolo inid?meta lemadiregi betir?dim?li layi yiramedalu weyimi bebisikil?ti yigalibalu. ye'?kali bik'ati inik'isik'as? bem?yaderigubeti g?z? libiwo be'?l?kitirokikayogirami (?.s?.j?.) yimeremerali. yedemi gif?tiwomi yimeremerali. yem?ketelewi kehone yihinini mirimera madiregi yichilalu-  yedereti h?imemi weyimi yelibi chigiri ?lebiwoti.  bizumi sayik'oyi yelibi dikami weyimi yelibi k'edo t'igena neberebiwo.  yelibi valivi chigiri ?lebiwoti.  libiwoni yem?yak'eribu yedemi sirochi met'ibebi yem?yasiketili beshita ?lebiwoti.  lelibi himemi kefitenya tegalach'ineti ina : - o ?d?si ye'?kali bik'ati inik'isik'as? pirogirami iyejemeriki newi. o tilik'i k'edo t'igena madiregi yasifeligiwotali. leh?k?mi sile : -   maninyawimi ?lerij? kalebiwoti.  yem?wesidwachewi medihan?tochi bemulu. yihi v?tam?nochi, 'it?s'iwati, ye'ayini t'ebitawochi, kir?mochi ina yaleh?k?mi yem?gezu medihan?tochini yat'ek'alilali.  irisiwo weyimi yeb?tesebi ?balati irisiwo inik'ilifi inid?wesidu bem?yaderigu medihan?tochi (madenizezha medihan?tochi) yagat'emuwoti maninyawimi chigiri.  yaderegachihuti maninyawimi k'edo t'igena.  yalewoti maninyawimi yedemi h?imemi.  yalewoti maninyawimi yet'?na hun?ta.  iriguzi kehonu weyimi iriguzi l?honu yichilalu. ?degawochu minidini nachewi? be'?t'ek'alayi yihi ?sitemamanyi fetena newi. honomi chigirochi l?kesetu yichilalu, yem?ketelutini ch'emiro:  yedereti himemi.  yemazori sim?ti weyimi k'elali ch'inik'ilati.  yetinifashi it'ireti.  yelibi miti mech'emeri weyimi medebenya yalihone.  masitaweki (mak'ileshileshi) weyimi masitaweki inidem?chilu yisemawotali.  yelibi dikami. yihi bet'ami ?lifo ?lifo newi. kefetenawi bef?ti mini yihonali? medihan?tochi  yetelemedu medihan?tochiwoni silemek'eyeri weyimi silemak'omi dokiteriwoni yit'eyik'u. yesikwari beshita medihan?tochini weyimi yedemi makem?yawochini kewesedu yihi ?sifelag? newi.  isitinifasi ketet'ek'emu wede fetenawi ?mit'uti. ?t'ek'alayi memer?yawochi  michu libisochini ina ye'igiri ch'amawochini yadirigu.  kemirimerawi bef?ti mebilatina met'et'ati yemayichilutini yedokiteriwoni memer?yawochi yiketelu.  bewisit'u kaf?yini yalewini maninyawinimi negeri ?yit'et'u weyimi ?yibilu. kefetenawi ke 24 se'atati bef?ti kaf?yini met'et'ati yak'umu.  kefetenawi bef?ti le 4 se'atati yahili n?kot?ni weyimi timibaho yeyazu miritochini ?yach'esu weyimi ?yit'ek'emu. lemak'omi iridata kefelegu h??k?miwoni yit'eyik'u. befetena wek'iti mini yihonali?    libisihini kewegebi wede layi ?wilik'ehi yehosip?tali gawani tilebisalehi.  ?l?kitirodochi weyimi t'igenawochi bederetiwo layi yideregalu.  bekinidiwo layi yedemi gif?ti maser?ya yideregali.  ye'?kali  bik'ati inik'isik'as? kemadiregiwo bef?ti komip?witeri yelibiwoni misili yiserali. yihinini lemadiregi : - o titenyalehi ina bederetihi layi j?li yideregali. o zenigi bej?li layi yinik'esak'esali. o misiluni lemesirati kewanidi yem?wet'u yedimit?s'i mogedochi wede komip?witeru yih?dalu.  kez?ya ye'?kali bik'ati inik'isik'as? madiregi tijemiralehi. betir?dim?li weyimi bebisikil?ti p?dali layi meramedi tichilalehi.  ye'?kali bik'ati inik'isik'as? bem?yaderigubeti g?z? yedemi gif?tiwo ina yelibi mitiwo yimeremeralu.  ye'?kali bik'ati inik'isik'as?wi yebelet'e kebadi weyimi fet'ani yihonali.  isikem?ketelewi diresi ye'?kali bik'ati inik'isik'as? yaderigalu : - o libiwo yetewesene dereja layi deriswali. o lemek'et'eli bet'ami dekimohali. o bedereti himemi, dikimeti weyimi mefizezi mikiniyati mek'et'eli ?yichilumi.  l?la yelibiwo misili inid?nesa wed?yawinu titenyalachihu. h?detu bedokiterochi ina behosip?talochi wisit'i l?leyayi yichilali. kefetena beh?wala mini met'ebek'i ichilalehu?  kefetenawo beh?wala :  yem?ketelutini madiregi yetelemede newi. o met'enenya himemi. o met'enenya dikami. wede medebenyawi derejawo isik?melesu diresi yelibi mitiwo ina yedemi gif?tiwo k'ut'it'iri yideregibachewali. kez?hi mirimera beh?wala minimi ?yineti ?d?si milikitochi l?tayuwoti ?yigebami. beb?ti wisit'i yem?ketelutini memer?yawochi yiketelu:  dokiteriwo inidem?chilu ketenagere :  yem?ketelutini madiregi yichilalu : - o betelemido yem?belutini yibelu. o medebenya inik'isik'as?wochiwoni yadirigu.  yaleh?k?mi mazezha ina beh??k?mi yetazezu medihan?tochini mewisedi beh?k?miwo inidetenegerewi bicha.  hulunimi yekititili gubinyitochi yak'oyu.  yefetenawoni wit'?ti maginyeti ye'irisiwo wisan? newi. zigiju s?honu wit'?tochiwoni inid?ti maginyeti inidem?chilu yit'eyik'u. yem?ketelewi kehone h??k?mi yanegagiru:  mazori weyimi k'elali ch'inik'ilati yisemawotali.  fet'ani weyimi medebenya yalihone yelibi miti  ?lewoti.  masitaweki weyimi masitaweki inidem?chilu yisemawotali.  rasi mitati ?lebihi.  yetinifashi it'ireti yisemawotali. yem?ketelewi kehone wed?yawinu iridata yaginyu : -  himemi weyimi gif?ti yagat'imuwotali : - o bederetiwo wisit'i. o menigagawo weyimi ?nigetiwo layi. o [employer]. o begira kinidiwo layi yiweridali.  tidekimalehi.  yemetenifesi chigiri ?lebiwoti. inez?hi milikitochi dinigetenya l?honu yichilalu. wed?yawinu yeh?ikimina iridata yaginyu. le'?kabab?wo yedinigetenya ?dega ?geligiloti yidewilu ([address] [employer]  milikitochu inidem?t'efu lemayeti ?yit'ebik'u.  rasiwoni wede hosip?tali ?yashikerikiru. mat'ek'aleya  yihi libiwo mini yahili inidem?sera yem?fetishi fetena newi.  kefetenawi bef?ti mebilatina met'et'ati yemayichilutini memer?yawochi yiketelu. kemirimerawi bef?ti yetelemedu medihan?tochiwoni mewisedi inidalebiwoti dokiteriwoni yit'eyik'u.  kemirimerawi 24 se'?ti bef?ti kaf?yini mewisedi yak'umu.  kefetenawi bef?ti le 4 se'atati yahili ?yach'esu weyimi bewisit'achewi n?kot?ni weyimi timibaho yalachewini miritochi ?yit'ek'emu.  bemirimerawi wek'iti ye'?kali bik'ati inik'isik'as? bem?yaderigubeti g?z? yedemi gif?tiwo ina yelibi mitiwo yimeremeralu. yihi mereja bet'?na inikibikab? ?k'irab?wo yeteset'ewotini mikiri lemetekati yetasebe ?yidelemi. ket'?na inikibikab? ?k'irab?wo gari yem?noriwotini maninyawinimi t'iyak'? meweyayetiwoni yaregagit'u. yeteshashalewi senedi :  04/15/2021 senedi

## 2022-04-15 NOTE — Progress Notes (Signed)
Cardiology Office Note:    Date:  04/15/2022   ID:  Jody Heath, DOB [birth year], MRN [id number]  PCP:  Mackie Pai, PA-C  Cardiologist:  Jenean Lindau, MD   Referring MD: Mackie Pai, PA-C    ASSESSMENT:    1. Palpitations   2. Chest pain of uncertain etiology   3. Cardiac murmur    PLAN:    In order of problems listed above:  Primary prevention stressed with the patient.  Importance of compliance with diet medication stressed and she vocalized understanding. Palpitations: I reviewed her blood work including TSH and it was unremarkable.  We will do a 2-week monitor to assess this.  Fortunately there is no dizziness or syncope. Cardiac murmur: Echocardiogram will be done to assess murmur heard on auscultation. Patient will be seen in follow-up appointment in 6 months or earlier if the patient has any concerns    Medication Adjustments/Labs and Tests Ordered: Current medicines are reviewed at length with the patient today.  Concerns regarding medicines are outlined above.  No orders of the defined types were placed in this encounter.  No orders of the defined types were placed in this encounter.    History of Present Illness:    Jody Heath is a 51 y.o. female who is being seen today for the evaluation of palpitations and chest pain at the request of [place], Percell Miller, [place].  Patient is a pleasant 51 year old female.  She has past medical history of palpitations.  She tells me that it happens occasionally and concerns her.  She also gives history of some chest discomfort not related to exertion.  She tells me that she does the treadmill for about 20 minutes without any concerns.  Her chest pain was bother her and she wants to be evaluated.  No history of dizziness or syncope.  At the time of my evaluation, the patient is alert awake oriented and in no distress.  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 Concern over ringing in the ears. See HPI.  Bilateral, not pulsatile.   Does not cause difficulty sleeping.  Mild impact on quality of life. EXAM shows normal external auditory canals and tympanic membranes. AUDIOGRAM: Shows basically normal hearing PLAN:  We discussed the generally benign and rarely curable nat   Vitamin D deficiency 11/09/2019    Past Surgical History:  Procedure Laterality Date   ABDOMINAL HYSTERECTOMY     ADRENALECTOMY     APPENDECTOMY N/A    Phreesia 01/19/2020   CESAREAN SECTION N/A    Phreesia 01/19/2020    Current Medications: No outpatient medications have been marked as taking for the 04/15/22 encounter (Office Visit) with Brenley Priore, Reita Cliche, MD.     Allergies:   Iodine   Social  History   Socioeconomic History   Marital status: Married    Spouse name: Not on file   Number of children: 2   Years of education: Not on file   Highest education level: Not on file  Occupational History   Not on file  Tobacco Use   Smoking status: Never   Smokeless tobacco: Never  Vaping Use   Vaping Use: Never used  Substance and Sexual Activity   Alcohol use: Yes    Comment: occ   Drug use: Never   Sexual activity: Not Currently  Other Topics Concern   Not on file  Social History Narrative   Not  on file   Social Determinants of Health   Financial Resource Strain: Not on file  Food Insecurity: Not on file  Transportation Needs: Not on file  Physical Activity: Not on file  Stress: Not on file  Social Connections: Not on file     Family History: The patient's family history includes Diabetes in her mother; Kidney failure in her father; Stroke in her mother and paternal uncle.  ROS:   Please see the history of present illness.    All other systems reviewed and are negative.  EKGs/Labs/Other Studies Reviewed:    The following studies were reviewed today: EKG reveals sinus rhythm and nonspecific ST-T changes   Recent Labs: 06/01/2021: TSH 4.24 01/26/2022: ALT 22; BUN 9; Creatinine, Ser 0.70; Hemoglobin 13.8; Platelets 172.0; Potassium 4.1; Sodium 138  Recent Lipid Panel    Component Value Date/Time   CHOL 176 06/01/2021 1125   CHOL 196 11/02/2019 1125   TRIG 182.0 (H) 06/01/2021 1125   HDL 46.20 06/01/2021 1125   HDL 51 11/02/2019 1125   CHOLHDL 4 06/01/2021 1125   VLDL 36.4 06/01/2021 1125   LDLCALC 94 06/01/2021 1125   LDLCALC 125 (H) 11/02/2019 1125    Physical Exam:    VS:  BP 138/62   Pulse 77   Ht '5\' 3"'$  (1.6 m)   Wt 175 lb (79.4 kg)   SpO2 96%   BMI 31.00 kg/m     Wt Readings from Last 3 Encounters:  04/15/22 175 lb (79.4 kg)  02/24/22 175 lb (79.4 kg)  02/10/22 175 lb (79.4 kg)     GEN: Patient is in no acute distress HEENT: Normal NECK: No JVD; No carotid bruits LYMPHATICS: No lymphadenopathy CARDIAC: S1 S2 regular, 2/6 systolic murmur at the apex. RESPIRATORY:  Clear to auscultation without rales, wheezing or rhonchi  ABDOMEN: Soft, non-tender, non-distended MUSCULOSKELETAL:  No edema; No deformity  SKIN: Warm and dry NEUROLOGIC:  Alert and oriented x 3 PSYCHIATRIC:  Normal affect    Signed, Jenean Lindau, MD  04/15/2022 3:15 PM    [facility]

## 2022-04-23 ENCOUNTER — Ambulatory Visit (INDEPENDENT_AMBULATORY_CARE_PROVIDER_SITE_OTHER): Payer: 59 | Admitting: Family Medicine

## 2022-04-23 ENCOUNTER — Encounter: Payer: Self-pay | Admitting: Family Medicine

## 2022-04-23 VITALS — BP 92/60 | Ht 63.0 in | Wt 175.0 lb

## 2022-04-23 DIAGNOSIS — M7502 Adhesive capsulitis of left shoulder: Secondary | ICD-10-CM

## 2022-04-23 NOTE — Progress Notes (Signed)
  Lucila RAYCHELLE HUDMAN - 51 y.o. female MRN [id number]  Date of birth: [birth year]  SUBJECTIVE:  Including CC & ROS.  No chief complaint on file.   Jody Heath is a 51 y.o. female that is following up for her left shoulder pain.  She has done well with physical therapy.  She has good motion in flexion but still has limited motion in external rotation.  The pain has improved.  An in person Amharic interpretor was used for this interview.   Review of Systems See HPI   HISTORY: Past Medical, Surgical, Social, and Family History Reviewed & Updated per EMR.   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 Concern over ringing in the ears. See HPI.  Bilateral, not pulsatile.   Does not cause difficulty sleeping.  Mild impact on quality of life. EXAM shows normal external auditory canals and tympanic membranes. AUDIOGRAM: Shows basically normal hearing PLAN:  We discussed the generally benign and rarely curable nat   Vitamin D deficiency 11/09/2019    Past Surgical History:  Procedure Laterality Date   ABDOMINAL HYSTERECTOMY     ADRENALECTOMY     APPENDECTOMY N/A    Phreesia 01/19/2020   CESAREAN SECTION N/A    Phreesia 01/19/2020      PHYSICAL EXAM:  VS: BP 92/60 (BP Location: Left Arm, Patient Position: Sitting)   Ht '5\' 3"'$  (1.6 m)   Wt 175 lb (79.4 kg)   BMI 31.00 kg/m  Physical Exam Gen: NAD, alert, cooperative with exam, well-appearing MSK:  Neurovascularly intact       ASSESSMENT & PLAN:   Adhesive capsulitis of left shoulder Continues to gain improvement in pain and function.  Still limited in external rotation compared to contralateral side.  Has done well with physical therapy. -Counseled on home exercise therapy and supportive care. -Continue physical therapy. -Could consider injection.

## 2022-04-23 NOTE — Assessment & Plan Note (Signed)
Continues to gain improvement in pain and function.  Still limited in external rotation compared to contralateral side.  Has done well with physical therapy. -Counseled on home exercise therapy and supportive care. -Continue physical therapy. -Could consider injection.

## 2022-04-23 NOTE — Patient Instructions (Signed)
Good to see you Please continue the exercises  Please continue physical therapy   Please send me a message in MyChart with any questions or updates.  Please see me back as needed or in 6 weeks.   --Dr. Raeford Razor

## 2022-05-05 ENCOUNTER — Telehealth (HOSPITAL_COMMUNITY): Payer: Self-pay

## 2022-05-05 NOTE — Telephone Encounter (Signed)
The interpreter line was used to leave instructions for the patient's test. Asked to call back with instructions. S.Jody Heath EMTP

## 2022-05-06 ENCOUNTER — Ambulatory Visit (HOSPITAL_COMMUNITY): Payer: 59

## 2022-05-07 ENCOUNTER — Telehealth: Payer: Self-pay

## 2022-05-07 DIAGNOSIS — I472 Ventricular tachycardia, unspecified: Secondary | ICD-10-CM

## 2022-05-07 NOTE — Telephone Encounter (Signed)
-----   Message from Jenean Lindau, MD sent at 05/06/2022  8:35 AM EDT ----- I think I think I sent you this message already.  Chem-7 and a mag level and exercise stress echo in the next few days.  Copy primary care Jenean Lindau, MD 05/06/2022 8:35 AM

## 2022-05-07 NOTE — Telephone Encounter (Signed)
Message sent in MyChart.

## 2022-05-11 ENCOUNTER — Telehealth: Payer: Self-pay

## 2022-05-11 DIAGNOSIS — I4729 Other ventricular tachycardia: Secondary | ICD-10-CM

## 2022-05-11 NOTE — Telephone Encounter (Signed)
-----   Message from Jenean Lindau, MD sent at 05/06/2022  8:35 AM EDT ----- I think I think I sent you this message already.  Chem-7 and a mag level and exercise stress echo in the next few days.  Copy primary care Jenean Lindau, MD 05/06/2022 8:35 AM

## 2022-06-01 ENCOUNTER — Other Ambulatory Visit (HOSPITAL_COMMUNITY): Payer: 59

## 2022-06-01 ENCOUNTER — Encounter (HOSPITAL_COMMUNITY): Payer: Self-pay

## 2022-06-01 NOTE — Progress Notes (Signed)
Verified appointment "no show" status with B. [place] at 14:00.

## 2022-06-09 ENCOUNTER — Telehealth (HOSPITAL_COMMUNITY): Payer: Self-pay | Admitting: *Deleted

## 2022-06-09 NOTE — Telephone Encounter (Signed)
Patient given instructions for upcoming stress echo using language line. Patient instructed to arrive by 2:30.  Jody Heath

## 2022-06-11 ENCOUNTER — Ambulatory Visit: Payer: 59 | Admitting: Family Medicine

## 2022-06-16 ENCOUNTER — Ambulatory Visit (HOSPITAL_COMMUNITY): Payer: 59

## 2022-06-16 ENCOUNTER — Ambulatory Visit (HOSPITAL_COMMUNITY): Payer: 59 | Attending: Cardiovascular Disease

## 2022-06-16 DIAGNOSIS — R079 Chest pain, unspecified: Secondary | ICD-10-CM | POA: Diagnosis present

## 2022-06-16 LAB — ECHOCARDIOGRAM STRESS TEST
Area-P 1/2: 3.65 cm2
S' Lateral: 3.3 cm

## 2022-06-16 MED ORDER — PERFLUTREN LIPID MICROSPHERE
1.0000 mL | INTRAVENOUS | Status: AC | PRN
  Administered 2022-06-16: 4 mL via INTRAVENOUS

## 2022-06-30 ENCOUNTER — Ambulatory Visit: Payer: 59 | Admitting: Family Medicine

## 2022-09-02 IMAGING — DX DG LUMBAR SPINE 2-3V
3 series · 3 of 3 positions shown · non-contrast
Comparison: None.

CLINICAL DATA: Low back pain.

EXAM:
LUMBAR SPINE - 2-3 VIEW

[l-spine ap]
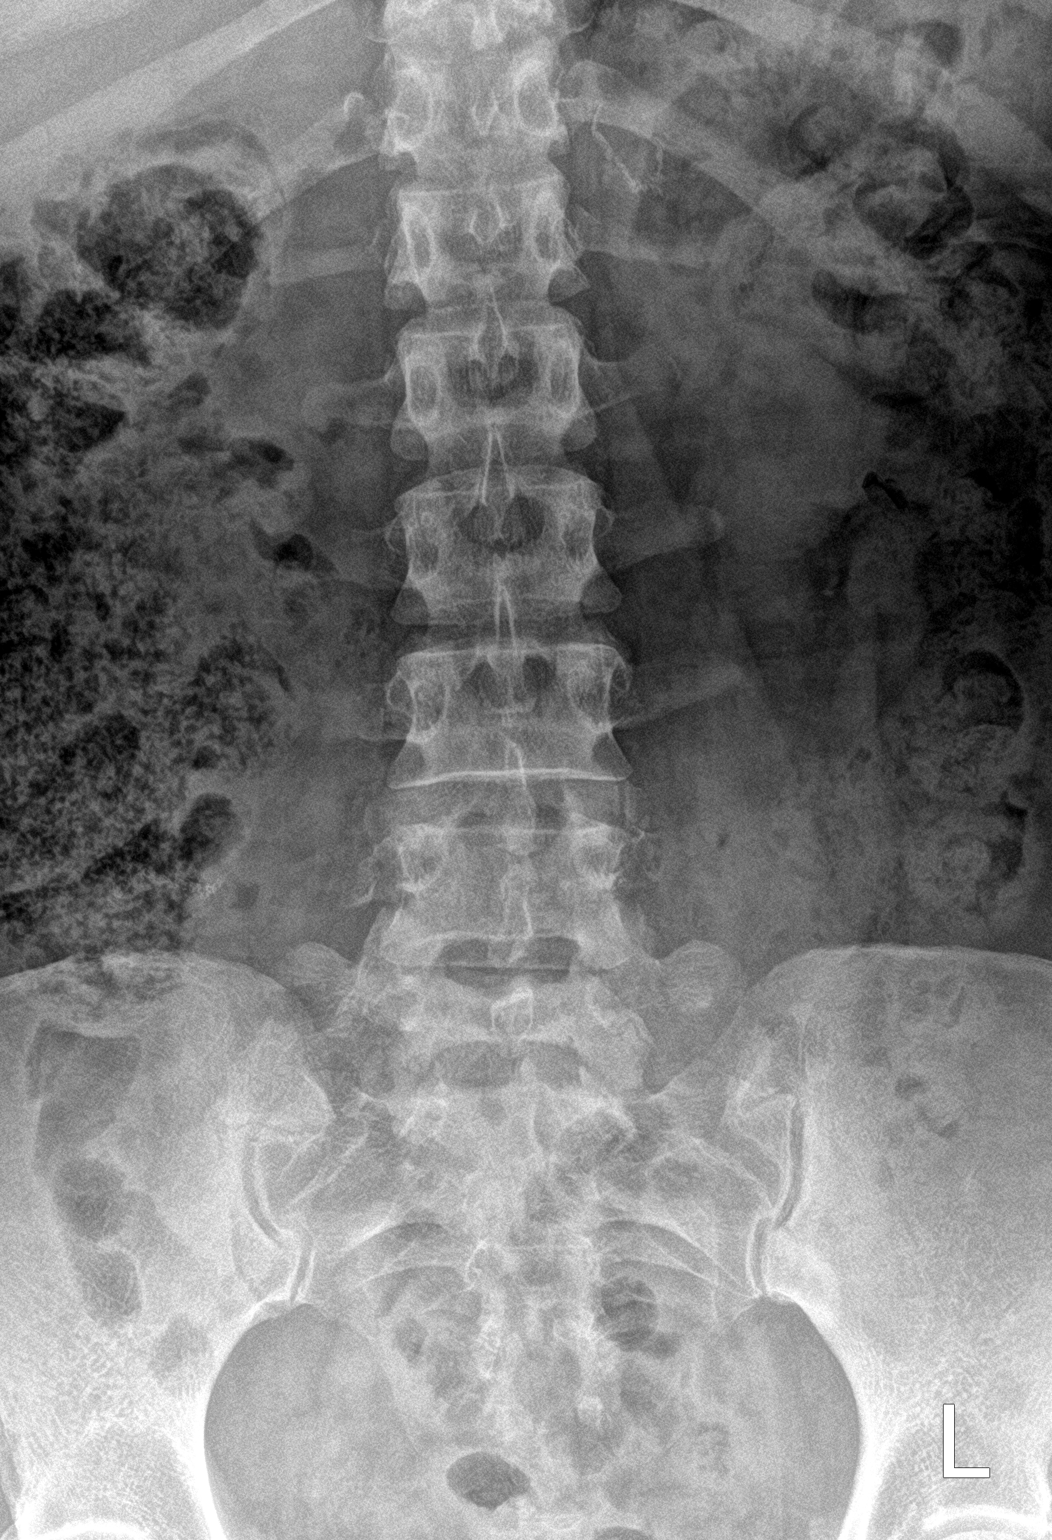

[l-spine lat]
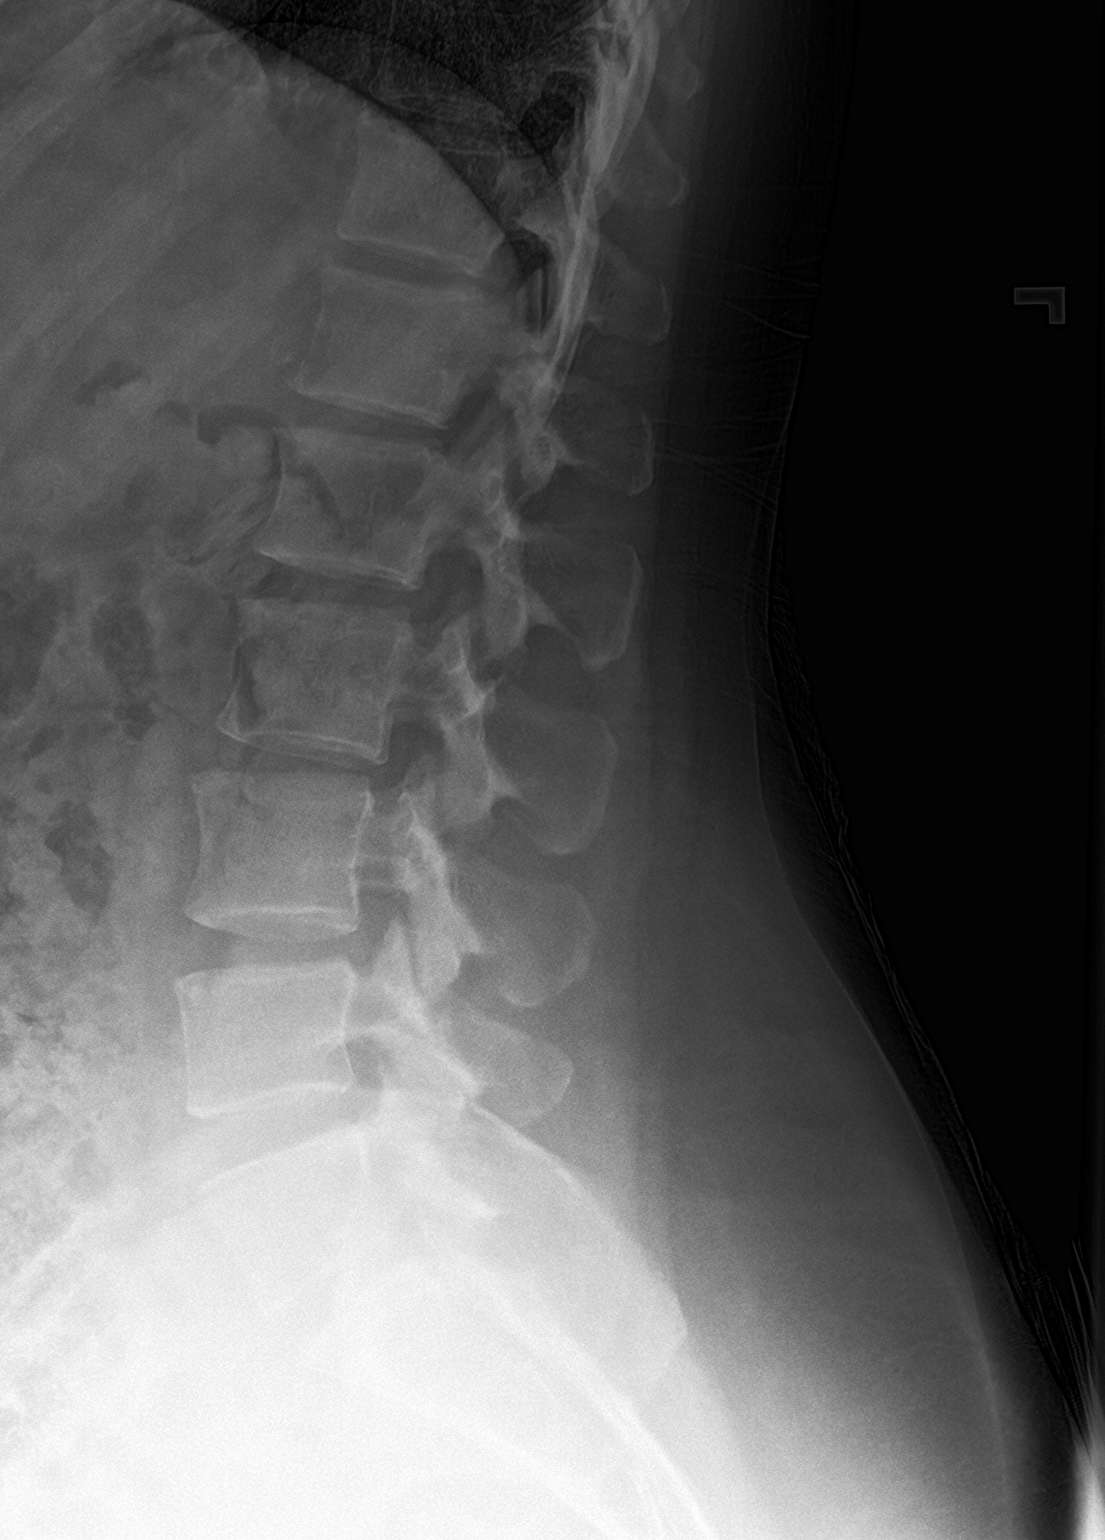

[l-spine spot]
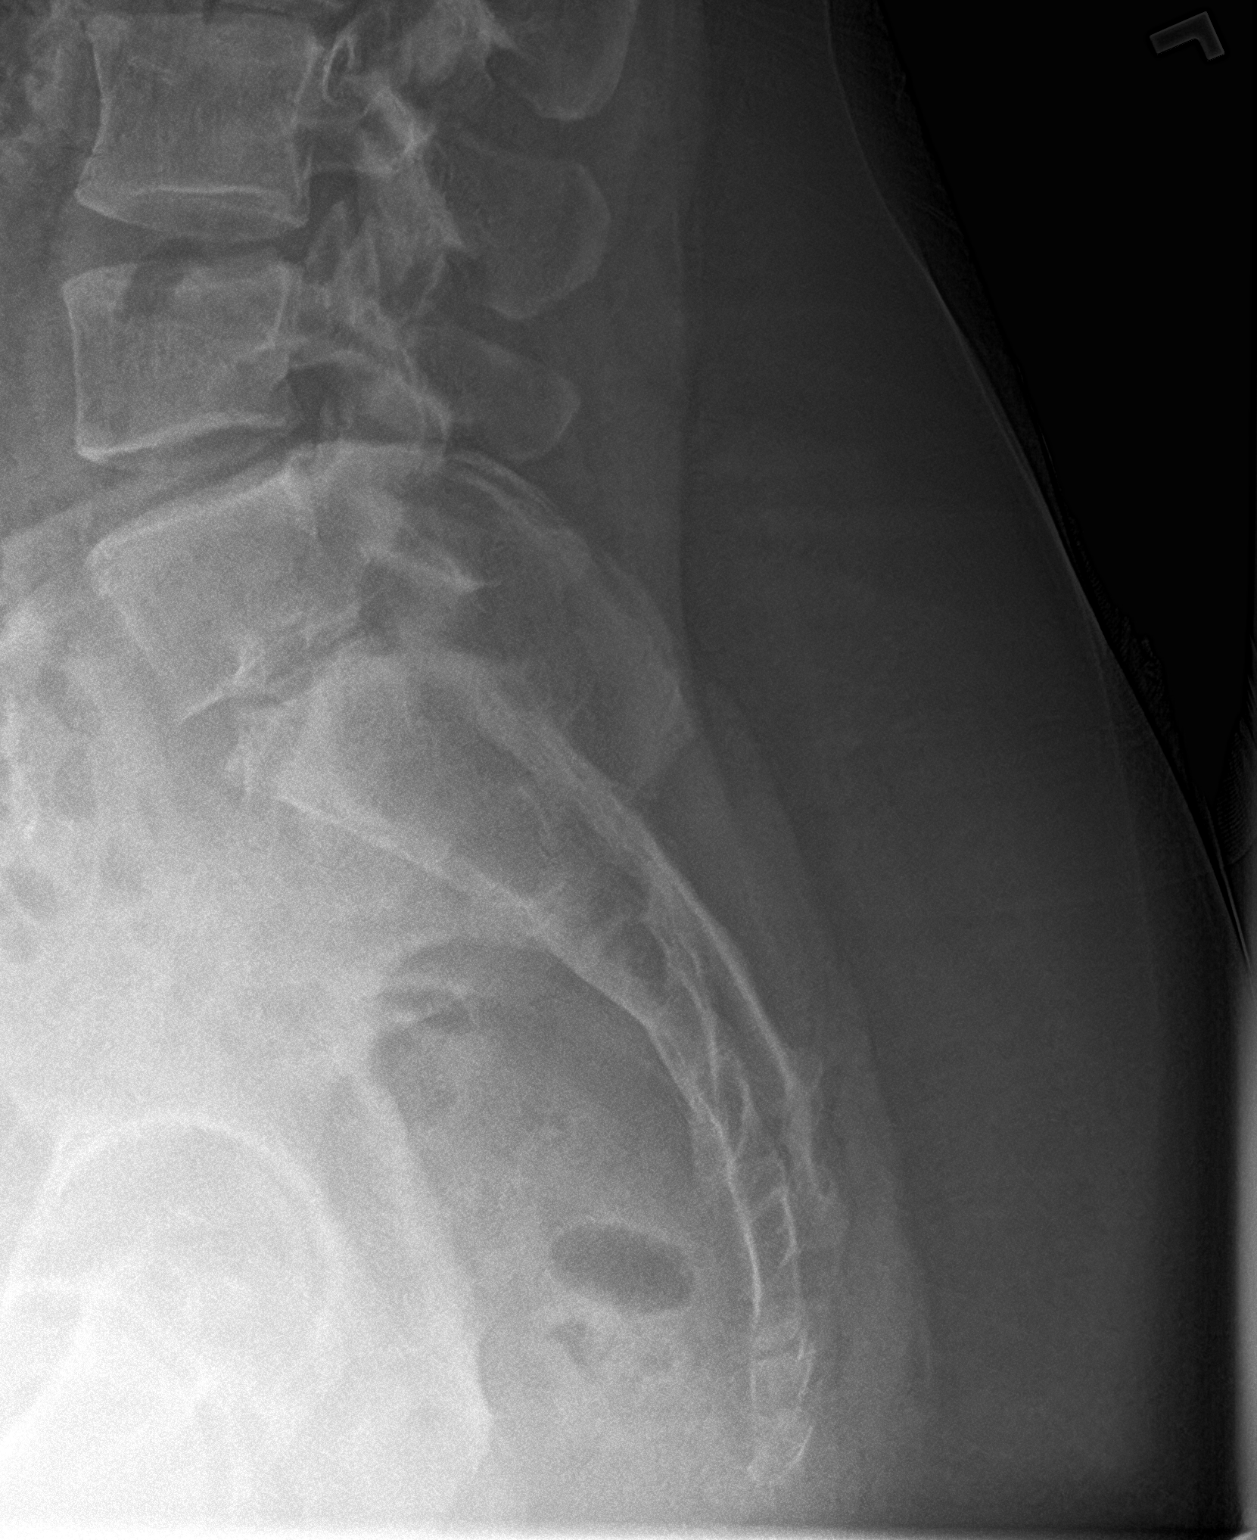

[3 of 3 positions shown; findings below may reference images not displayed]

FINDINGS: There is no evidence of lumbar spine fracture. Alignment is normal.
Intervertebral disc spaces are maintained except at the lumbosacral
junction where there is transitional anatomy and partial disc space
loss. There is mild facet joint spurring from L3 down.

The SI joints are unremarkable, as visualized. Moderate fecal
stasis.
IMPRESSION: 1. No evidence of fractures or malalignment.
2. Transitional anatomy and partial disc space loss lumbosacral
junction.
3. Lower lumbar facet joint spurring.
4. Constipation.

## 2022-11-19 LAB — BASIC METABOLIC PANEL
BUN/Creatinine Ratio: 9 (ref 9–23)
BUN: 7 mg/dL (ref 6–24)
CO2: 23 mmol/L (ref 20–29)
Calcium: 10.3 mg/dL — ABNORMAL HIGH (ref 8.7–10.2)
Chloride: 102 mmol/L (ref 96–106)
Creatinine, Ser: 0.82 mg/dL (ref 0.57–1.00)
Glucose: 106 mg/dL — ABNORMAL HIGH (ref 70–99)
Potassium: 4.2 mmol/L (ref 3.5–5.2)
Sodium: 140 mmol/L (ref 134–144)
eGFR: 87 mL/min/{1.73_m2} (ref >59)

## 2022-11-19 LAB — MAGNESIUM: Magnesium: 2 mg/dL (ref 1.6–2.3)

## 2022-11-29 ENCOUNTER — Encounter: Payer: Self-pay | Admitting: *Deleted

## 2022-12-02 ENCOUNTER — Encounter: Payer: Self-pay | Admitting: Cardiology

## 2022-12-02 ENCOUNTER — Ambulatory Visit: Payer: 59 | Attending: Cardiology | Admitting: Cardiology

## 2022-12-02 VITALS — BP 110/66 | HR 76 | Ht 63.0 in | Wt 167.0 lb

## 2022-12-02 DIAGNOSIS — I4729 Other ventricular tachycardia: Secondary | ICD-10-CM | POA: Diagnosis not present

## 2022-12-02 NOTE — Progress Notes (Signed)
Cardiology Office Note:    Date:  12/02/2022   ID:  Jody Heath, DOB 1970/10/19, MRN [id number]  PCP:  Esperanza Richters, PA-C  Cardiologist:  Garwin Brothers, MD   Referring MD: Esperanza Richters, PA-C    ASSESSMENT:    1. NSVT (nonsustained ventricular tachycardia)    PLAN:    In order of problems listed above:  Nonsustained ventricular tachycardia: Frequent PVCs and dyspnea on exertion: I discussed my findings with the patient at length including monitoring and stress test report.  She vocalized understanding and questions were answered to her satisfaction.  She leads a sedentary lifestyle.  When she exerts she has shortness of breath.  I recommended CT coronary angiography with FFR.  She tells me that she is not keen on it at this time she wants to think about it and get back to [place].  Should she decide we will set her up for the test.  Benefits risks of the test explained and questions were answered to her satisfaction.  I wanted to consider a low dose beta-blocker for PVCs but her blood pressure is borderline and I am worried about hypotension and its adverse consequences of this were discussed with her. Patient will be seen in follow-up appointment in 6 months or earlier if the patient has any concerns.    Medication Adjustments/Labs and Tests Ordered: Current medicines are reviewed at length with the patient today.  Concerns regarding medicines are outlined above.  No orders of the defined types were placed in this encounter.  No orders of the defined types were placed in this encounter.    No chief complaint on file.    History of Present Illness:    Jody Heath is a 52 y.o. female.  Patient was evaluated with a stress test.  She had nonsustained ventricular tachycardia on monitoring.  Stress test did not reveal any evidence of ischemia.  Frequent PVCs were seen.  Subsequently she has done fine.  She does not exercise much.  She leads a sedentary lifestyle.   No palpitations or syncope.  She is seen with an interpreter.  At the time of my evaluation, the patient is alert awake oriented and in no distress.  She gives history of some dyspnea on exertion.  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 Concern over ringing in the ears. See HPI.  Bilateral, not pulsatile.   Does not cause difficulty sleeping.  Mild impact on quality of life. EXAM shows normal external auditory canals and tympanic membranes. AUDIOGRAM: Shows basically normal hearing PLAN:  We discussed the generally benign and rarely curable nat   Vitamin D deficiency 11/09/2019    Past Surgical History:  Procedure  Laterality Date   ABDOMINAL HYSTERECTOMY     ADRENALECTOMY     APPENDECTOMY N/A    Phreesia 01/19/2020   CESAREAN SECTION N/A    Phreesia 01/19/2020    Current Medications: Current Meds  Medication Sig   cabergoline (DOSTINEX) 0.5 MG tablet TAKE 1/2 TABLETS BY MOUTH 2 TIMES A WEEK.   lidocaine (LIDODERM) 5 % Place 1 patch onto the skin daily. Remove & Discard patch within 12 hours or as directed by MD   methocarbamol (ROBAXIN) 500 MG tablet Take 1 tablet (500 mg total) by  mouth 2 (two) times daily.   naproxen (NAPROSYN) 500 MG tablet Take 1 tablet (500 mg total) by mouth 2 (two) times daily.   Vitamin D, Ergocalciferol, (DRISDOL) 1.25 MG (50000 UNIT) CAPS capsule Take 1 capsule (50,000 Units total) by mouth every 7 (seven) days.     Allergies:   Iodine   Social History   Socioeconomic History   Marital status: Married    Spouse name: Not on file   Number of children: 2   Years of education: Not on file   Highest education level: Not on file  Occupational History   Not on file  Tobacco Use   Smoking status: Never   Smokeless tobacco: Never  Vaping Use   Vaping Use: Never used  Substance and Sexual Activity   Alcohol use: Yes    Comment: occ   Drug use: Never   Sexual activity: Not Currently  Other Topics Concern   Not on file  Social History Narrative   Not on file   Social Determinants of Health   Financial Resource Strain: Not on file  Food Insecurity: Not on file  Transportation Needs: Not on file  Physical Activity: Not on file  Stress: Not on file  Social Connections: Not on file     Family History: The patient's family history includes Diabetes in her mother; Kidney failure in her father; Stroke in her mother and paternal uncle.  ROS:   Please see the history of present illness.    All other systems reviewed and are negative.  EKGs/Labs/Other Studies Reviewed:    The following studies were reviewed today: I discussed my findings with the patient at length.   Recent Labs: 01/26/2022: ALT 22; Hemoglobin 13.8; Platelets 172.0 11/18/2022: BUN 7; Creatinine, Ser 0.82; Magnesium 2.0; Potassium 4.2; Sodium 140  Recent Lipid Panel    Component Value Date/Time   CHOL 176 06/01/2021 1125   CHOL 196 11/02/2019 1125   TRIG 182.0 (H) 06/01/2021 1125   HDL 46.20 06/01/2021 1125   HDL 51 11/02/2019 1125   CHOLHDL 4 06/01/2021 1125   VLDL 36.4 06/01/2021 1125   LDLCALC 94 06/01/2021 1125   LDLCALC 125 (H) 11/02/2019 1125     Physical Exam:    VS:  BP 110/66   Pulse 76   Ht 5\' 3"  (1.6 m)   Wt 167 lb 0.6 oz (75.8 kg)   SpO2 97%   BMI 29.59 kg/m     Wt Readings from Last 3 Encounters:  12/02/22 167 lb 0.6 oz (75.8 kg)  04/23/22 175 lb (79.4 kg)  04/15/22 175 lb (79.4 kg)     GEN: Patient is in no acute distress HEENT: Normal NECK: No JVD; No carotid bruits LYMPHATICS: No lymphadenopathy CARDIAC: Hear sounds regular, 2/6 systolic murmur at the apex. RESPIRATORY:  Clear to auscultation without rales, wheezing or rhonchi  ABDOMEN: Soft, non-tender, non-distended MUSCULOSKELETAL:  No edema; No deformity  SKIN:  Warm and dry NEUROLOGIC:  Alert and oriented x 3 PSYCHIATRIC:  Normal affect   Signed, Garwin Brothers, MD  12/02/2022 3:20 PM    [facility]

## 2022-12-02 NOTE — Patient Instructions (Signed)
Medication Instructions:  Your physician recommends that you continue on your current medications as directed. Please refer to the Current Medication list given to you today.  *If you need a refill on your cardiac medications before your next appointment, please call your pharmacy*   Lab Work: None ordered If you have labs (blood work) drawn today and your tests are completely normal, you will receive your results only by: MyChart Message (if you have MyChart) OR A paper copy in the mail If you have any lab test that is abnormal or we need to change your treatment, we will call you to review the results.   Testing/Procedures: None ordered   Follow-Up: At [facility], you and your health needs are our priority.  As part of our continuing mission to provide you with exceptional heart care, we have created designated Provider Care Teams.  These Care Teams include your primary Cardiologist (physician) and Advanced Practice Providers (APPs -  Physician Assistants and Nurse Practitioners) who all work together to provide you with the care you need, when you need it.  We recommend signing up for the patient portal called "MyChart".  Sign up information is provided on this After Visit Summary.  MyChart is used to connect with patients for Virtual Visits (Telemedicine).  Patients are able to view lab/test results, encounter notes, upcoming appointments, etc.  Non-urgent messages can be sent to your provider as well.   To learn more about what you can do with MyChart, go to [email or web address].    Your next appointment:   12 month(s)  The format for your next appointment:   In Person  Provider:   Rajan Revankar, MD    Other Instructions none  Important Information About Sugar      

## 2022-12-07 ENCOUNTER — Other Ambulatory Visit: Payer: Self-pay | Admitting: Endocrinology

## 2022-12-07 ENCOUNTER — Other Ambulatory Visit (INDEPENDENT_AMBULATORY_CARE_PROVIDER_SITE_OTHER): Payer: 59

## 2022-12-07 DIAGNOSIS — E236 Other disorders of pituitary gland: Secondary | ICD-10-CM

## 2022-12-07 DIAGNOSIS — D352 Benign neoplasm of pituitary gland: Secondary | ICD-10-CM

## 2022-12-07 LAB — T4, FREE: Free T4: 0.92 ng/dL (ref 0.60–1.60)

## 2022-12-08 LAB — PROLACTIN: Prolactin: 13.2 ng/mL (ref 4.8–33.4)

## 2022-12-10 ENCOUNTER — Ambulatory Visit (INDEPENDENT_AMBULATORY_CARE_PROVIDER_SITE_OTHER): Payer: 59 | Admitting: Endocrinology

## 2022-12-10 ENCOUNTER — Encounter: Payer: Self-pay | Admitting: Endocrinology

## 2022-12-10 VITALS — BP 110/70 | HR 68 | Wt 170.0 lb

## 2022-12-10 DIAGNOSIS — E221 Hyperprolactinemia: Secondary | ICD-10-CM | POA: Diagnosis not present

## 2022-12-10 NOTE — Progress Notes (Signed)
Patient ID: CALVIN CHURA, female   DOB: 04-08-1971, 52 y.o.   MRN: [id number]              Chief complaint: Follow-up of high prolactin  History of Present Illness  Prior history: She apparently had problems with milk discharge from her breasts in  2013 and was found to have high prolactin level of about 130 She was diagnosed in [place] In 2016 her MRI scan showed a nonenhancing mass measuring 4x 5 mm She thinks that with taking cabergoline half tablet twice a week her prolactin level had come down and after about 1-1/2 years the medication was stopped but she did not have a follow-up subsequently  Recent history:  In 3/21 her prolactin level was 109 on her initial visit She had not complained of any spontaneous milky secretions from her breasts at that time  She has had a hysterectomy and cannot assess menstrual cycles  Because of significant hyperprolactinemia she was started on cabergoline in 11/2019 She had been previously continued on cabergoline 0.25 mg When her prolactin was low normal in 5/23 and it was felt that she was perimenopausal as he was told to stop cabergoline  Since last visit has not noticed any spontaneous milky breast secretions but she usually does not try to check for this herself  She has had some sweating spells which are not troublesome or nocturnal Last year her estradiol level was low normal  She has been off cabergoline since 12/2021 Although she had a prolactin of 21 last August she did not come for her visit This is now again normal at 42  Prolactin levels:  Lab Results  Component Value Date   PROLACTIN 13.2 12/07/2022   PROLACTIN 21.3 04/06/2022   PROLACTIN 8.4 01/01/2022   PROLACTIN 12.8 07/13/2021    Since previous MRI was not available repeat imaging was done MRI of pituitary gland showed the following results  MRI of pituitary gland, 12/26/2019:  Asymmetric pituitary gland mostly flattened against the sellar floor with  asymmetric contrast enhancement along the medial wall of the left cavernous sinus.  No well defined hypoenhancing lesion identified.   Report from 2018 not available  She has no other evidence of hypopituitarism Last thyroid functions also normal  Lab Results  Component Value Date   TSH 4.24 06/01/2021   TSH 3.930 11/02/2019   FREET4 0.92 12/07/2022   FREET4 0.85 01/01/2022   FREET4 0.73 06/01/2021     Allergies as of 12/10/2022       Reactions   Iodine Hives        Medication List        Accurate as of December 10, 2022 11:29 AM. If you have any questions, ask your nurse or doctor.          cabergoline 0.5 MG tablet Commonly known as: DOSTINEX TAKE 1/2 TABLETS BY MOUTH 2 TIMES A WEEK.   lidocaine 5 % Commonly known as: Lidoderm Place 1 patch onto the skin daily. Remove & Discard patch within 12 hours or as directed by MD   methocarbamol 500 MG tablet Commonly known as: ROBAXIN Take 1 tablet (500 mg total) by mouth 2 (two) times daily.   naproxen 500 MG tablet Commonly known as: NAPROSYN Take 1 tablet (500 mg total) by mouth 2 (two) times daily.   Vitamin D (Ergocalciferol) 1.25 MG (50000 UNIT) Caps capsule Commonly known as: DRISDOL Take 1 capsule (50,000 Units total) by mouth every 7 (seven) days.  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 Concern over ringing in the ears. See HPI.  Bilateral, not pulsatile.   Does not cause difficulty sleeping.  Mild impact on quality of life. EXAM shows normal external auditory canals and tympanic membranes. AUDIOGRAM: Shows basically normal hearing PLAN:  We discussed the generally benign and rarely curable nat   Vitamin D deficiency 11/09/2019    Past Surgical History:  Procedure Laterality Date   ABDOMINAL HYSTERECTOMY     ADRENALECTOMY     APPENDECTOMY N/A    [employer] 01/19/2020   CESAREAN SECTION N/A    [employer] 01/19/2020    Family History  Problem Relation Age of Onset   Diabetes Mother    Stroke Mother    Stroke Paternal Uncle    Kidney failure Father     Social History:  reports that she has never smoked. She has never used smokeless tobacco. She reports current alcohol use. She reports that she does not use drugs.   Review of Systems  She has vitamin D deficiency on routine testing and is being treated by PCP   No history of headaches recently  EXAM:  BP 110/70   Pulse 68   Wt 170 lb (77.1 kg)   SpO2 98%   BMI 30.11 kg/m   Physical Exam   ASSESSMENT:     Prolactinoma with baseline hyperprolactinemia with a level of 109 MRI of the pituitary gland in 5/21 suggested empty sella syndrome although she had a 5 mm prolactinoma seen in 2016 previously  She is now off cabergoline since 5/23, however previously on 0.25 mg cabergoline only once a week  She has no recurrence of the hyperprolactinemia for about 11 months and asymptomatic Also no headaches  Unable to assess her menstrual cycles because of hysterectomy  She appears to have started menopause about a year  ago, current symptoms are mild in sweating episodes      PLAN:     Discussed that she is in remission from her hyperprolactinemia and history of prolactinoma She will likely not need any further treatment but she does want to follow-up in another 6 months for rechecking her prolactin  Reather Littler 12/10/22   Note: This office note was prepared with Dragon voice recognition system technology. Any transcriptional errors that result from this process are unintentional.

## 2023-06-08 ENCOUNTER — Other Ambulatory Visit: Payer: 59

## 2023-06-13 ENCOUNTER — Ambulatory Visit: Payer: 59 | Admitting: "Endocrinology
